# Patient Record
Sex: Female | Born: 1945 | Race: Black or African American | Hispanic: No | Smoking: Never smoker
Health system: Southern US, Community
[De-identification: ages and names within clinical notes are randomized; demographics above are authoritative.]

## PROBLEM LIST (undated history)

## (undated) DIAGNOSIS — M810 Age-related osteoporosis without current pathological fracture: Secondary | ICD-10-CM

## (undated) HISTORY — PX: OTHER SURGICAL HISTORY: SHX169

## (undated) HISTORY — PX: APPENDECTOMY: SHX54

## (undated) HISTORY — DX: Age-related osteoporosis without current pathological fracture: M81.0

## (undated) HISTORY — PX: TUBAL LIGATION: SHX77

## (undated) HISTORY — PX: TONSILLECTOMY: SUR1361

---

## 2002-09-15 ENCOUNTER — Encounter: Payer: Self-pay | Admitting: Cardiology

## 2002-09-15 ENCOUNTER — Encounter: Admission: RE | Admit: 2002-09-15 | Discharge: 2002-09-15 | Payer: Self-pay | Admitting: Cardiology

## 2002-10-19 ENCOUNTER — Encounter: Payer: Self-pay | Admitting: Cardiology

## 2002-10-19 ENCOUNTER — Encounter: Admission: RE | Admit: 2002-10-19 | Discharge: 2002-10-19 | Payer: Self-pay | Admitting: Cardiology

## 2002-11-20 ENCOUNTER — Encounter: Payer: Self-pay | Admitting: Cardiology

## 2002-11-20 ENCOUNTER — Encounter: Admission: RE | Admit: 2002-11-20 | Discharge: 2002-11-20 | Payer: Self-pay | Admitting: Cardiology

## 2003-03-22 ENCOUNTER — Ambulatory Visit (HOSPITAL_COMMUNITY): Admission: RE | Admit: 2003-03-22 | Discharge: 2003-03-22 | Payer: Self-pay | Admitting: Gastroenterology

## 2003-12-28 ENCOUNTER — Encounter: Admission: RE | Admit: 2003-12-28 | Discharge: 2003-12-28 | Payer: Self-pay | Admitting: Internal Medicine

## 2004-12-30 ENCOUNTER — Encounter: Admission: RE | Admit: 2004-12-30 | Discharge: 2004-12-30 | Payer: Self-pay | Admitting: Internal Medicine

## 2005-01-15 ENCOUNTER — Encounter: Admission: RE | Admit: 2005-01-15 | Discharge: 2005-01-15 | Payer: Self-pay | Admitting: Internal Medicine

## 2006-01-20 ENCOUNTER — Encounter: Admission: RE | Admit: 2006-01-20 | Discharge: 2006-01-20 | Payer: Self-pay | Admitting: Internal Medicine

## 2006-11-09 ENCOUNTER — Encounter: Admission: RE | Admit: 2006-11-09 | Discharge: 2006-11-09 | Payer: Self-pay | Admitting: Internal Medicine

## 2007-02-08 ENCOUNTER — Encounter: Admission: RE | Admit: 2007-02-08 | Discharge: 2007-02-08 | Payer: Self-pay | Admitting: Internal Medicine

## 2009-01-21 ENCOUNTER — Encounter: Admission: RE | Admit: 2009-01-21 | Discharge: 2009-01-21 | Payer: Self-pay | Admitting: Internal Medicine

## 2010-03-09 ENCOUNTER — Encounter: Payer: Self-pay | Admitting: Internal Medicine

## 2010-07-04 NOTE — Op Note (Signed)
NAME:  Ashley Dominguez, Ashley Dominguez                     ACCOUNT NO.:  [id number]   MEDICAL RECORD NO.:  [id number]                   PATIENT TYPE:  AMB   LOCATION:  ENDO                                 FACILITY:  [facility]   PHYSICIAN:  Petra Kuba, M.D.                 DATE OF BIRTH:  1945-10-22   DATE OF PROCEDURE:  03/22/2003  DATE OF DISCHARGE:                                 OPERATIVE REPORT   PROCEDURE:  Colonoscopy.   INDICATIONS:  Screening.  Consent was signed after risks, benefits, methods  and options were thoroughly discussed in the office.   MEDICATIONS USED:  1. Demerol 60 mg .  2. Versed 5 mg.   DESCRIPTION OF PROCEDURE:  Rectal inspection is pertinent for external  hemorrhoids.  Digital examination was negative.  Video pediatric adjustable  colonoscope was inserted and easily advanced around the colon to the cecum.  This did not require any abdominal pressure or any position changes.  No  abnormalities were seen on insertion.  The cecum was identified by the  appendiceal orifice and the ileocecal valve.  We backed the scope out and  then inserted a short way into the terminal ileum, which was normal.  Photo  documentation was obtained.  The scope was slowly withdrawn.   The cecum, ascending, transverse and majority of the descending was normal.  There was a rare early left-sided diverticula just beginning to form on the  left.  Once back in the rectum, anorectal pull through in retroflexion  confirms some small hemorrhoids.   The scope was re-inserted a short way up the left side of the colon.  Air  was suctioned and scope removed.  The patient tolerated the procedure well,  there were no obvious immediate complication.   ENDOSCOPIC DIAGNOSES:  1. Internal/external small hemorrhoids.  2. Rare early left-sided diverticula, just being started.  3. Otherwise within normal limits to the terminal ileum.   PLAN:  Yearly rectals and guaiacs per either gynecology or Dr.  Shana Chute.  Happy to see back p.r.n., otherwise repeat screening in 5-10 years.                                               Petra Kuba, M.D.    MEM/MEDQ  D:  03/22/2003  T:  03/22/2003  Job:  [id number]   cc:   Osvaldo Shipper. Spruill, M.D.  [address] [address]  [place]  [place] [address]  Fax: [phone]

## 2010-08-21 ENCOUNTER — Other Ambulatory Visit: Payer: Self-pay | Admitting: Internal Medicine

## 2010-08-21 DIAGNOSIS — Z1231 Encounter for screening mammogram for malignant neoplasm of breast: Secondary | ICD-10-CM

## 2010-08-28 ENCOUNTER — Ambulatory Visit
Admission: RE | Admit: 2010-08-28 | Discharge: 2010-08-28 | Disposition: A | Discharge status: Home / Self Care | Payer: Private Health Insurance - Indemnity | Source: Ambulatory Visit | Attending: Internal Medicine | Admitting: Internal Medicine

## 2010-08-28 DIAGNOSIS — Z1231 Encounter for screening mammogram for malignant neoplasm of breast: Secondary | ICD-10-CM

## 2012-01-19 ENCOUNTER — Other Ambulatory Visit: Payer: Self-pay | Admitting: Internal Medicine

## 2012-01-19 DIAGNOSIS — Z1231 Encounter for screening mammogram for malignant neoplasm of breast: Secondary | ICD-10-CM

## 2012-01-27 ENCOUNTER — Ambulatory Visit
Admission: RE | Admit: 2012-01-27 | Discharge: 2012-01-27 | Disposition: A | Discharge status: Home / Self Care | Payer: Private Health Insurance - Indemnity | Source: Ambulatory Visit | Attending: Internal Medicine | Admitting: Internal Medicine

## 2012-01-27 DIAGNOSIS — Z1231 Encounter for screening mammogram for malignant neoplasm of breast: Secondary | ICD-10-CM

## 2012-02-23 LAB — HM DEXA SCAN

## 2013-02-27 ENCOUNTER — Other Ambulatory Visit: Payer: Self-pay

## 2013-02-27 DIAGNOSIS — Z1231 Encounter for screening mammogram for malignant neoplasm of breast: Secondary | ICD-10-CM

## 2013-03-22 ENCOUNTER — Ambulatory Visit
Admission: RE | Admit: 2013-03-22 | Discharge: 2013-03-22 | Disposition: A | Discharge status: Home / Self Care | Payer: BC Managed Care – PPO | Source: Ambulatory Visit

## 2013-03-22 DIAGNOSIS — Z1231 Encounter for screening mammogram for malignant neoplasm of breast: Secondary | ICD-10-CM

## 2014-07-09 ENCOUNTER — Other Ambulatory Visit: Payer: Self-pay

## 2014-07-09 DIAGNOSIS — Z1231 Encounter for screening mammogram for malignant neoplasm of breast: Secondary | ICD-10-CM

## 2014-07-13 ENCOUNTER — Ambulatory Visit
Admission: RE | Admit: 2014-07-13 | Discharge: 2014-07-13 | Disposition: A | Discharge status: Home / Self Care | Payer: BLUE CROSS/BLUE SHIELD | Source: Ambulatory Visit

## 2014-07-13 DIAGNOSIS — Z1231 Encounter for screening mammogram for malignant neoplasm of breast: Secondary | ICD-10-CM

## 2015-01-07 LAB — HM COLONOSCOPY

## 2015-07-10 ENCOUNTER — Other Ambulatory Visit: Payer: Self-pay

## 2015-07-10 DIAGNOSIS — Z1231 Encounter for screening mammogram for malignant neoplasm of breast: Secondary | ICD-10-CM

## 2015-10-01 ENCOUNTER — Ambulatory Visit: Payer: BLUE CROSS/BLUE SHIELD

## 2018-01-07 ENCOUNTER — Telehealth: Payer: Self-pay

## 2018-01-07 NOTE — Telephone Encounter (Signed)
Returned the pt's call and left a message that I was returning her call to schedule her annual.

## 2018-01-11 ENCOUNTER — Ambulatory Visit (INDEPENDENT_AMBULATORY_CARE_PROVIDER_SITE_OTHER): Payer: 59 | Admitting: Internal Medicine

## 2018-01-11 ENCOUNTER — Ambulatory Visit: Payer: 59 | Admitting: Internal Medicine

## 2018-01-11 ENCOUNTER — Encounter: Payer: Self-pay | Admitting: Internal Medicine

## 2018-01-11 VITALS — BP 140/90 | HR 76 | Temp 98.1°F | Ht 62.0 in | Wt 125.2 lb

## 2018-01-11 VITALS — BP 140/90 | HR 76 | Temp 98.1°F | Ht 62.0 in | Wt 125.0 lb

## 2018-01-11 DIAGNOSIS — N898 Other specified noninflammatory disorders of vagina: Secondary | ICD-10-CM | POA: Diagnosis not present

## 2018-01-11 DIAGNOSIS — E2839 Other primary ovarian failure: Secondary | ICD-10-CM

## 2018-01-11 DIAGNOSIS — Z1239 Encounter for other screening for malignant neoplasm of breast: Secondary | ICD-10-CM

## 2018-01-11 DIAGNOSIS — Z0001 Encounter for general adult medical examination with abnormal findings: Secondary | ICD-10-CM | POA: Diagnosis not present

## 2018-01-11 DIAGNOSIS — Z1211 Encounter for screening for malignant neoplasm of colon: Secondary | ICD-10-CM | POA: Diagnosis not present

## 2018-01-11 DIAGNOSIS — R3129 Other microscopic hematuria: Secondary | ICD-10-CM

## 2018-01-11 DIAGNOSIS — R6882 Decreased libido: Secondary | ICD-10-CM

## 2018-01-11 LAB — POCT URINALYSIS DIPSTICK
BILIRUBIN UA: NEGATIVE
Glucose, UA: NEGATIVE
KETONES UA: 15
Leukocytes, UA: NEGATIVE
Nitrite, UA: NEGATIVE
PH UA: 5.5 (ref 5.0–8.0)
Protein, UA: NEGATIVE
UROBILINOGEN UA: 0.2 U/dL

## 2018-01-11 NOTE — Patient Instructions (Signed)
Preventive Care 72 Years and Older, Female Preventive care refers to lifestyle choices and visits with your health care provider that can promote health and wellness. What does preventive care include?  A yearly physical exam. This is also called an annual well check.  Dental exams once or twice a year.  Routine eye exams. Ask your health care provider how often you should have your eyes checked.  Personal lifestyle choices, including: ? Daily care of your teeth and gums. ? Regular physical activity. ? Eating a healthy diet. ? Avoiding tobacco and drug use. ? Limiting alcohol use. ? Practicing safe sex. ? Taking low-dose aspirin every day. ? Taking vitamin and mineral supplements as recommended by your health care provider. What happens during an annual well check? The services and screenings done by your health care provider during your annual well check will depend on your age, overall health, lifestyle risk factors, and family history of disease. Counseling Your health care provider may ask you questions about your:  Alcohol use.  Tobacco use.  Drug use.  Emotional well-being.  Home and relationship well-being.  Sexual activity.  Eating habits.  History of falls.  Memory and ability to understand (cognition).  Work and work environment.  Reproductive health.  Screening You may have the following tests or measurements:  Height, weight, and BMI.  Blood pressure.  Lipid and cholesterol levels. These may be checked every 5 years, or more frequently if you are over 50 years old.  Skin check.  Lung cancer screening. You may have this screening every year starting at age 55 if you have a 30-pack-year history of smoking and currently smoke or have quit within the past 15 years.  Fecal occult blood test (FOBT) of the stool. You may have this test every year starting at age 50.  Flexible sigmoidoscopy or colonoscopy. You may have a sigmoidoscopy every 5 years or  a colonoscopy every 10 years starting at age 50.  Hepatitis C blood test.  Hepatitis B blood test.  Sexually transmitted disease (STD) testing.  Diabetes screening. This is done by checking your blood sugar (glucose) after you have not eaten for a while (fasting). You may have this done every 1-3 years.  Bone density scan. This is done to screen for osteoporosis. You may have this done starting at age 65.  Mammogram. This may be done every 1-2 years. Talk to your health care provider about how often you should have regular mammograms.  Talk with your health care provider about your test results, treatment options, and if necessary, the need for more tests. Vaccines Your health care provider may recommend certain vaccines, such as:  Influenza vaccine. This is recommended every year.  Tetanus, diphtheria, and acellular pertussis (Tdap, Td) vaccine. You may need a Td booster every 10 years.  Varicella vaccine. You may need this if you have not been vaccinated.  Zoster vaccine. You may need this after age 60.  Measles, mumps, and rubella (MMR) vaccine. You may need at least one dose of MMR if you were born in 1957 or later. You may also need a second dose.  Pneumococcal 13-valent conjugate (PCV13) vaccine. One dose is recommended after age 65.  Pneumococcal polysaccharide (PPSV23) vaccine. One dose is recommended after age 65.  Meningococcal vaccine. You may need this if you have certain conditions.  Hepatitis A vaccine. You may need this if you have certain conditions or if you travel or work in places where you may be exposed to hepatitis   A.  Hepatitis B vaccine. You may need this if you have certain conditions or if you travel or work in places where you may be exposed to hepatitis B.  Haemophilus influenzae type b (Hib) vaccine. You may need this if you have certain conditions.  Talk to your health care provider about which screenings and vaccines you need and how often you  need them. This information is not intended to replace advice given to you by your health care provider. Make sure you discuss any questions you have with your health care provider. Document Released: 03/01/2015 Document Revised: 10/23/2015 Document Reviewed: 12/04/2014 Elsevier Interactive Patient Education  2018 Elsevier Inc.  

## 2018-01-11 NOTE — Progress Notes (Addendum)
Subjective:     Patient ID: Ashley Dominguez , female    DOB: [birth year] , 72 y.o.   MRN: [id number]  CC- Need my annual check up  HPI Pt is here for yearly physical. Was supposed to have it done in June, but missed it.  Her BP at home usually is around 120's systolic. Has noticed on occasion stool smelling matter from vagina in the past few months. Has made certain was not from rectal area. Denies any vaginal pain.  Past Medical History:  Diagnosis Date  . Osteoporosis      Family History  Problem Relation Age of Onset  . Cancer Mother   . Alcohol abuse Father   . Diabetes Father   . Diabetes Brother   . Cancer Maternal Aunt     No current outpatient medications on file.   Not on File   Review of Systems  Constitutional: Negative for appetite change, chills, diaphoresis, fatigue, fever and unexpected weight change.  HENT: Positive for congestion. Negative for dental problem, drooling, ear discharge, ear pain, facial swelling, hearing loss, mouth sores, nosebleeds, postnasal drip, rhinorrhea, sinus pressure, sinus pain, sneezing, sore throat, tinnitus, trouble swallowing and voice change.        Early in the fall noticed stuffy nose, but fine now.   Eyes: Positive for visual disturbance. Negative for photophobia, pain, discharge, redness and itching.       Has cataract and is planning to have it done in the next 3 months  Respiratory: Negative for cough and shortness of breath.   Cardiovascular: Negative for chest pain, palpitations and leg swelling.  Gastrointestinal: Negative.   Endocrine: Negative for cold intolerance, heat intolerance, polydipsia, polyphagia and polyuria.  Genitourinary: Positive for frequency and vaginal discharge. Negative for dysuria, enuresis, pelvic pain, urgency, vaginal bleeding and vaginal pain.       Drinks a lot. She has noticed in the past week, stool like matter from vagina, but thinner and smells like stool.   Musculoskeletal: Negative.    Skin: Negative.   Allergic/Immunologic: Positive for environmental allergies and food allergies.       Is lactose intolerant  Neurological: Negative for dizziness, tremors, syncope, speech difficulty, weakness, numbness and headaches.  Hematological: Negative for adenopathy. Does not bruise/bleed easily.  Psychiatric/Behavioral: Positive for sleep disturbance. The patient is nervous/anxious.        Only sleeps 4-5 a night when she works. Has been sleeping this way for 30+ years. When she is off she sleeps 7-8h per night.  Sometimes  Gets anxious due to finances. Denies depression.      There were no vitals filed for this visit. There is no height or weight on file to calculate BMI.   Objective:  Physical Exam  There were no vitals taken for this visit.  General Appearance:    Alert, cooperative, no distress, appears younger than stated age  Head:    Normocephalic, without obvious abnormality, atraumatic  Eyes:    PERRL, conjunctiva/corneas clear, EOM's intact, both eyes  Ears:    Normal TM's and external ear canals, both ears  Nose:   Nares normal, septum midline, mucosa normal, no drainage    or sinus tenderness  Throat:   Lips, mucosa, and tongue normal; teeth and gums normal  Neck:   Supple, symmetrical, trachea midline, no adenopathy;    thyroid:  no enlargement/tenderness/nodules; no carotid   bruit   Back:     Symmetric, no curvature, ROM normal, no CVA tenderness  Lungs:     Clear to auscultation bilaterally, respirations unlabored  Chest Wall:    No tenderness or deformity   Heart:    Regular rate and rhythm, S1 and S2 normal, no murmur, rub   or gallop  Breast Exam:    No tenderness, masses, or nipple abnormality  Abdomen:     Soft, non-tender, bowel sounds active all four quadrants,    no masses, no organomegaly  Genitalia:    Normal female without lesion, cervix is pink. Has small amount of dark yellow slightly thick discharge, but does not have an odor that I noted. I  obtained a culture.   Rectal:    Normal tone,  no masses or tenderness; Has a large skin tag externally where she had an external hemorrhoid in the past. This does not bother her.    guaiac negative stool  Extremities:   Extremities normal, atraumatic, no cyanosis or edema  Pulses:   2+ and symmetric all extremities  Skin:   Skin color, texture, turgor normal, no rashes or lesions  Lymph nodes:   Cervical, supraclavicular, and axillary nodes normal  Neurologic:   CNII-XII intact, normal strength, sensation and reflexes    Throughout. Normal Rhomberg, tandem gait, heel and tip toe walk, and finger to nose.       Assessment And Plan:    1. Encounter for general adult medical examination with abnormal findings- routine. FU 1 y - Lipid Profile - CMP14 + Anion Gap - POCT Urinalysis Dipstick (81002) - T3, free - CBC no Diff - TSH - T4, Free 2. Vaginal discharge- acute. Aerobic and anaerobic culture was sent out. - Anaerobic and Aerobic Culture We will inform her of the results come back.  3. Screening for breast cancer- screen.  - MM Digital Screening; Future  4. Decreased estrogen level- chronic - DG DXA FRACTURE ASSESSMENT; Future  5. Hematuria, microscopic- mild.  - Culture, Urine     Marciana Uplinger RODRIGUEZ-SOUTHWORTH, PA-C

## 2018-01-12 ENCOUNTER — Encounter: Payer: Self-pay | Admitting: Internal Medicine

## 2018-01-12 LAB — CBC
HEMATOCRIT: 36.8 % (ref 34.0–46.6)
HEMOGLOBIN: 12.1 g/dL (ref 11.1–15.9)
MCH: 30.1 pg (ref 26.6–33.0)
MCHC: 32.9 g/dL (ref 31.5–35.7)
MCV: 92 fL (ref 79–97)
Platelets: 382 10*3/uL (ref 150–450)
RBC: 4.02 x10E6/uL (ref 3.77–5.28)
RDW: 11.7 % — ABNORMAL LOW (ref 12.3–15.4)
WBC: 4.4 10*3/uL (ref 3.4–10.8)

## 2018-01-12 LAB — CMP14 + ANION GAP
A/G RATIO: 1.8 (ref 1.2–2.2)
ALBUMIN: 4.4 g/dL (ref 3.5–4.8)
ALT: 10 IU/L (ref 0–32)
ANION GAP: 15 mmol/L (ref 10.0–18.0)
AST: 14 IU/L (ref 0–40)
Alkaline Phosphatase: 60 IU/L (ref 39–117)
BUN / CREAT RATIO: 15 (ref 12–28)
BUN: 13 mg/dL (ref 8–27)
Bilirubin Total: 0.5 mg/dL (ref 0.0–1.2)
CO2: 25 mmol/L (ref 20–29)
CREATININE: 0.86 mg/dL (ref 0.57–1.00)
Calcium: 9.5 mg/dL (ref 8.7–10.3)
Chloride: 99 mmol/L (ref 96–106)
GFR calc Af Amer: 78 mL/min/{1.73_m2} (ref >59)
GFR, EST NON AFRICAN AMERICAN: 68 mL/min/{1.73_m2} (ref >59)
GLOBULIN, TOTAL: 2.5 g/dL (ref 1.5–4.5)
Glucose: 93 mg/dL (ref 65–99)
POTASSIUM: 3.7 mmol/L (ref 3.5–5.2)
SODIUM: 139 mmol/L (ref 134–144)
Total Protein: 6.9 g/dL (ref 6.0–8.5)

## 2018-01-12 LAB — LIPID PANEL
CHOLESTEROL TOTAL: 175 mg/dL (ref 100–199)
Chol/HDL Ratio: 2.4 ratio (ref 0.0–4.4)
HDL: 74 mg/dL (ref >39)
LDL CALC: 89 mg/dL (ref 0–99)
TRIGLYCERIDES: 62 mg/dL (ref 0–149)
VLDL Cholesterol Cal: 12 mg/dL (ref 5–40)

## 2018-01-12 LAB — POC HEMOCCULT BLD/STL (OFFICE/1-CARD/DIAGNOSTIC): Fecal Occult Blood, POC: NEGATIVE

## 2018-01-12 LAB — URINE CULTURE

## 2018-01-12 NOTE — Progress Notes (Signed)
This was open in error

## 2018-01-15 LAB — ANAEROBIC AND AEROBIC CULTURE

## 2018-01-18 ENCOUNTER — Other Ambulatory Visit: Payer: Self-pay | Admitting: Internal Medicine

## 2018-01-18 DIAGNOSIS — N898 Other specified noninflammatory disorders of vagina: Secondary | ICD-10-CM

## 2018-01-18 MED ORDER — SULFAMETHOXAZOLE-TRIMETHOPRIM 800-160 MG PO TABS: 1.0000 | ORAL_TABLET | Freq: Two times a day (BID) | ORAL | 0 refills | Status: AC

## 2018-01-18 NOTE — Progress Notes (Unsigned)
I called pt and answered her questions about her vaginal swab results. She said she is certain she sees stool when she wipes from vagina sometimes, and would like to be referred to a specialist.

## 2018-01-24 NOTE — Progress Notes (Signed)
Patient notified she stated she feels better.

## 2018-01-27 ENCOUNTER — Other Ambulatory Visit: Payer: Self-pay | Admitting: Internal Medicine

## 2018-01-27 DIAGNOSIS — E2839 Other primary ovarian failure: Secondary | ICD-10-CM

## 2018-02-23 ENCOUNTER — Ambulatory Visit: Payer: BLUE CROSS/BLUE SHIELD

## 2018-02-24 ENCOUNTER — Inpatient Hospital Stay: Admission: RE | Admit: 2018-02-24 | Payer: BLUE CROSS/BLUE SHIELD | Source: Ambulatory Visit

## 2018-02-25 ENCOUNTER — Ambulatory Visit
Admission: RE | Admit: 2018-02-25 | Discharge: 2018-02-25 | Disposition: A | Discharge status: Home / Self Care | Payer: 59 | Source: Ambulatory Visit | Attending: Internal Medicine | Admitting: Internal Medicine

## 2018-02-25 DIAGNOSIS — Z1239 Encounter for other screening for malignant neoplasm of breast: Secondary | ICD-10-CM

## 2018-03-29 ENCOUNTER — Other Ambulatory Visit: Payer: BLUE CROSS/BLUE SHIELD

## 2018-03-31 ENCOUNTER — Other Ambulatory Visit: Payer: BLUE CROSS/BLUE SHIELD

## 2019-01-17 ENCOUNTER — Encounter: Payer: 59 | Admitting: Internal Medicine

## 2019-01-26 ENCOUNTER — Ambulatory Visit (INDEPENDENT_AMBULATORY_CARE_PROVIDER_SITE_OTHER): Payer: 59 | Admitting: Internal Medicine

## 2019-01-26 ENCOUNTER — Encounter: Payer: Self-pay | Admitting: Internal Medicine

## 2019-01-26 ENCOUNTER — Other Ambulatory Visit: Payer: Self-pay

## 2019-01-26 VITALS — BP 124/78 | HR 68 | Temp 98.0°F | Ht 62.0 in | Wt 124.8 lb

## 2019-01-26 DIAGNOSIS — Z0001 Encounter for general adult medical examination with abnormal findings: Secondary | ICD-10-CM | POA: Diagnosis not present

## 2019-01-26 DIAGNOSIS — R9431 Abnormal electrocardiogram [ECG] [EKG]: Secondary | ICD-10-CM

## 2019-01-26 DIAGNOSIS — F1020 Alcohol dependence, uncomplicated: Secondary | ICD-10-CM | POA: Diagnosis not present

## 2019-01-26 DIAGNOSIS — Z1211 Encounter for screening for malignant neoplasm of colon: Secondary | ICD-10-CM

## 2019-01-26 LAB — POCT URINALYSIS DIPSTICK
Bilirubin, UA: NEGATIVE
Glucose, UA: NEGATIVE
Ketones, UA: NEGATIVE
Nitrite, UA: NEGATIVE
Protein, UA: NEGATIVE
Spec Grav, UA: >=1.03 — AB (ref 1.010–1.025)
Urobilinogen, UA: 0.2 E.U./dL
pH, UA: 5.5 (ref 5.0–8.0)

## 2019-01-26 NOTE — Progress Notes (Signed)
This visit occurred during the SARS-CoV-2 public health emergency.  Safety protocols were in place, including screening questions prior to the visit, additional usage of staff PPE, and extensive cleaning of exam room while observing appropriate contact time as indicated for disinfecting solutions.  Subjective:     Patient ID: Ashley Dominguez , female    DOB: 1945/09/14 , 73 y.o.   MRN: [id number]   Chief Complaint  Patient presents with  . Annual Exam    HPI Pt is here for annual physical. Denies having any complaints. Admits she has been drinking more since staying at home. Drinks 3-4 beers a day and 3-5 cocktails a week.    Past Medical History:  Diagnosis Date  . Osteoporosis      Family History  Problem Relation Age of Onset  . Cancer Mother   . Alcohol abuse Father   . Diabetes Father   . Diabetes Brother   . Cancer Maternal Aunt   . Breast cancer Neg Hx     No current outpatient medications on file.   No Known Allergies   Review of Systems  All neg Today's Vitals   01/26/19 1622  BP: 124/78  Pulse: 68  Temp: 98 F (36.7 C)  TempSrc: Oral  Weight: 124 lb 12.8 oz (56.6 kg)  Height: 5\' 2"  (1.575 m)   Body mass index is 22.83 kg/m.   Objective:  Physical Exam  BP 124/78 (BP Location: Left Arm, Patient Position: Sitting, Cuff Size: Normal)   Pulse 68   Temp 98 F (36.7 C) (Oral)   Ht 5\' 2"  (1.575 m)   Wt 124 lb 12.8 oz (56.6 kg)   BMI 22.83 kg/m   General Appearance:    Alert, cooperative, no distress, appears stated age  Head:    Normocephalic, without obvious abnormality, atraumatic  Eyes:    PERRL, conjunctiva/corneas clear, EOM's intact, fundi    benign, both eyes  Ears:    Normal TM's and external ear canals, both ears  Nose:   Nares normal, septum midline, mucosa normal, no drainage    or sinus tenderness  Throat:   Lips, mucosa, and tongue normal; teeth and gums normal  Neck:   Supple, symmetrical, trachea midline, no adenopathy;     thyroid:  no enlargement/tenderness/nodules; no carotid   bruit or JVD  Back:     Symmetric, no curvature, ROM normal, no CVA tenderness  Lungs:     Clear to auscultation bilaterally, respirations unlabored  Chest Wall:    No tenderness or deformity   Heart:    Regular rate and rhythm, S1 and S2 normal, no murmur, rub   or gallop  Breast Exam:    No tenderness, masses, or nipple abnormality  Abdomen:     Soft, non-tender, bowel sounds active all four quadrants,    no masses, no organomegaly  Genitalia:    declined  Rectal:    Normal tone, normal prostate, no masses or tenderness;   guaiac negative stool  Extremities:   Extremities normal, atraumatic, no cyanosis or edema  Pulses:   2+ and symmetric all extremities  Skin:   Skin color, texture, turgor normal, no rashes or lesions  Lymph nodes:   Cervical, supraclavicular, and axillary nodes normal  Neurologic:   CNII-XII intact, normal strength, sensation and reflexes    throughout  EKG- shows T abnormality- anterolateral ischemia which is unchanged from last year.      Assessment And Plan:   1. Encounter for general  adult medical examination with abnormal findings- routine. FU 1 y.  - POCT Urinalysis Dipstick (81002) - EKG 12-Lead - CMP14 + Anion Gap - CBC no Diff - Lipid panel - TSH  2. Abnormal EKG- unchanged from last year's, no action.   3- Alcohol use- new. Discussed with her, that for health benefits, she needs to limit to 7 drinks a week only.      GGT was ordered, but lad did not ran it.      Sylvio Weatherall RODRIGUEZ-SOUTHWORTH, PA-C    THE PATIENT IS ENCOURAGED TO PRACTICE SOCIAL DISTANCING DUE TO THE COVID-19 PANDEMIC.

## 2019-01-27 LAB — CMP14 + ANION GAP
ALT: 11 IU/L (ref 0–32)
AST: 18 IU/L (ref 0–40)
Albumin/Globulin Ratio: 2 (ref 1.2–2.2)
Albumin: 4.5 g/dL (ref 3.7–4.7)
Alkaline Phosphatase: 85 IU/L (ref 39–117)
Anion Gap: 11 mmol/L (ref 10.0–18.0)
BUN/Creatinine Ratio: 13 (ref 12–28)
BUN: 12 mg/dL (ref 8–27)
Bilirubin Total: 0.5 mg/dL (ref 0.0–1.2)
CO2: 27 mmol/L (ref 20–29)
Calcium: 9.8 mg/dL (ref 8.7–10.3)
Chloride: 102 mmol/L (ref 96–106)
Creatinine, Ser: 0.93 mg/dL (ref 0.57–1.00)
GFR calc Af Amer: 71 mL/min/{1.73_m2} (ref >59)
GFR calc non Af Amer: 61 mL/min/{1.73_m2} (ref >59)
Globulin, Total: 2.3 g/dL (ref 1.5–4.5)
Glucose: 105 mg/dL — ABNORMAL HIGH (ref 65–99)
Potassium: 4.6 mmol/L (ref 3.5–5.2)
Sodium: 140 mmol/L (ref 134–144)
Total Protein: 6.8 g/dL (ref 6.0–8.5)

## 2019-01-27 LAB — LIPID PANEL
Chol/HDL Ratio: 2.3 ratio (ref 0.0–4.4)
Cholesterol, Total: 197 mg/dL (ref 100–199)
HDL: 87 mg/dL (ref >39)
LDL Chol Calc (NIH): 96 mg/dL (ref 0–99)
Triglycerides: 78 mg/dL (ref 0–149)
VLDL Cholesterol Cal: 14 mg/dL (ref 5–40)

## 2019-01-27 LAB — CBC
Hematocrit: 38.9 % (ref 34.0–46.6)
Hemoglobin: 12.8 g/dL (ref 11.1–15.9)
MCH: 30.4 pg (ref 26.6–33.0)
MCHC: 32.9 g/dL (ref 31.5–35.7)
MCV: 92 fL (ref 79–97)
Platelets: 395 10*3/uL (ref 150–450)
RBC: 4.21 x10E6/uL (ref 3.77–5.28)
RDW: 11.8 % (ref 11.7–15.4)
WBC: 6.1 10*3/uL (ref 3.4–10.8)

## 2019-01-27 LAB — TSH: TSH: 0.87 u[IU]/mL (ref 0.450–4.500)

## 2019-01-31 ENCOUNTER — Encounter: Payer: Self-pay | Admitting: Internal Medicine

## 2019-02-01 ENCOUNTER — Encounter: Payer: Self-pay | Admitting: Internal Medicine

## 2019-02-03 ENCOUNTER — Other Ambulatory Visit: Payer: Self-pay | Admitting: Internal Medicine

## 2019-02-03 DIAGNOSIS — R7309 Other abnormal glucose: Secondary | ICD-10-CM

## 2019-02-06 ENCOUNTER — Telehealth: Payer: Self-pay

## 2019-02-06 ENCOUNTER — Encounter: Payer: Self-pay | Admitting: Internal Medicine

## 2019-02-06 NOTE — Telephone Encounter (Signed)
Spoke w/pt gave her [employer] message. Pt stated she did not want to do that at this time but she would reach back out to you about it. She also stated she seen your note. Pt also asked if you can fill out her physical form. She will need it before 2021

## 2019-02-06 NOTE — Telephone Encounter (Signed)
-----   Message from Shelby Mattocks, PA-C sent at 02/06/2019  8:50 AM EST ----- He Mychart message returned back to me. Will you please ask her to read it, or you may read it for her. Thanks.

## 2019-02-11 IMAGING — MG DIGITAL SCREENING BILATERAL MAMMOGRAM WITH CAD
2 series · 2 of 2 positions shown · non-contrast
Comparison: Previous exam(s).

CLINICAL DATA: Screening.

EXAM:
DIGITAL SCREENING BILATERAL MAMMOGRAM WITH CAD

[R CC]
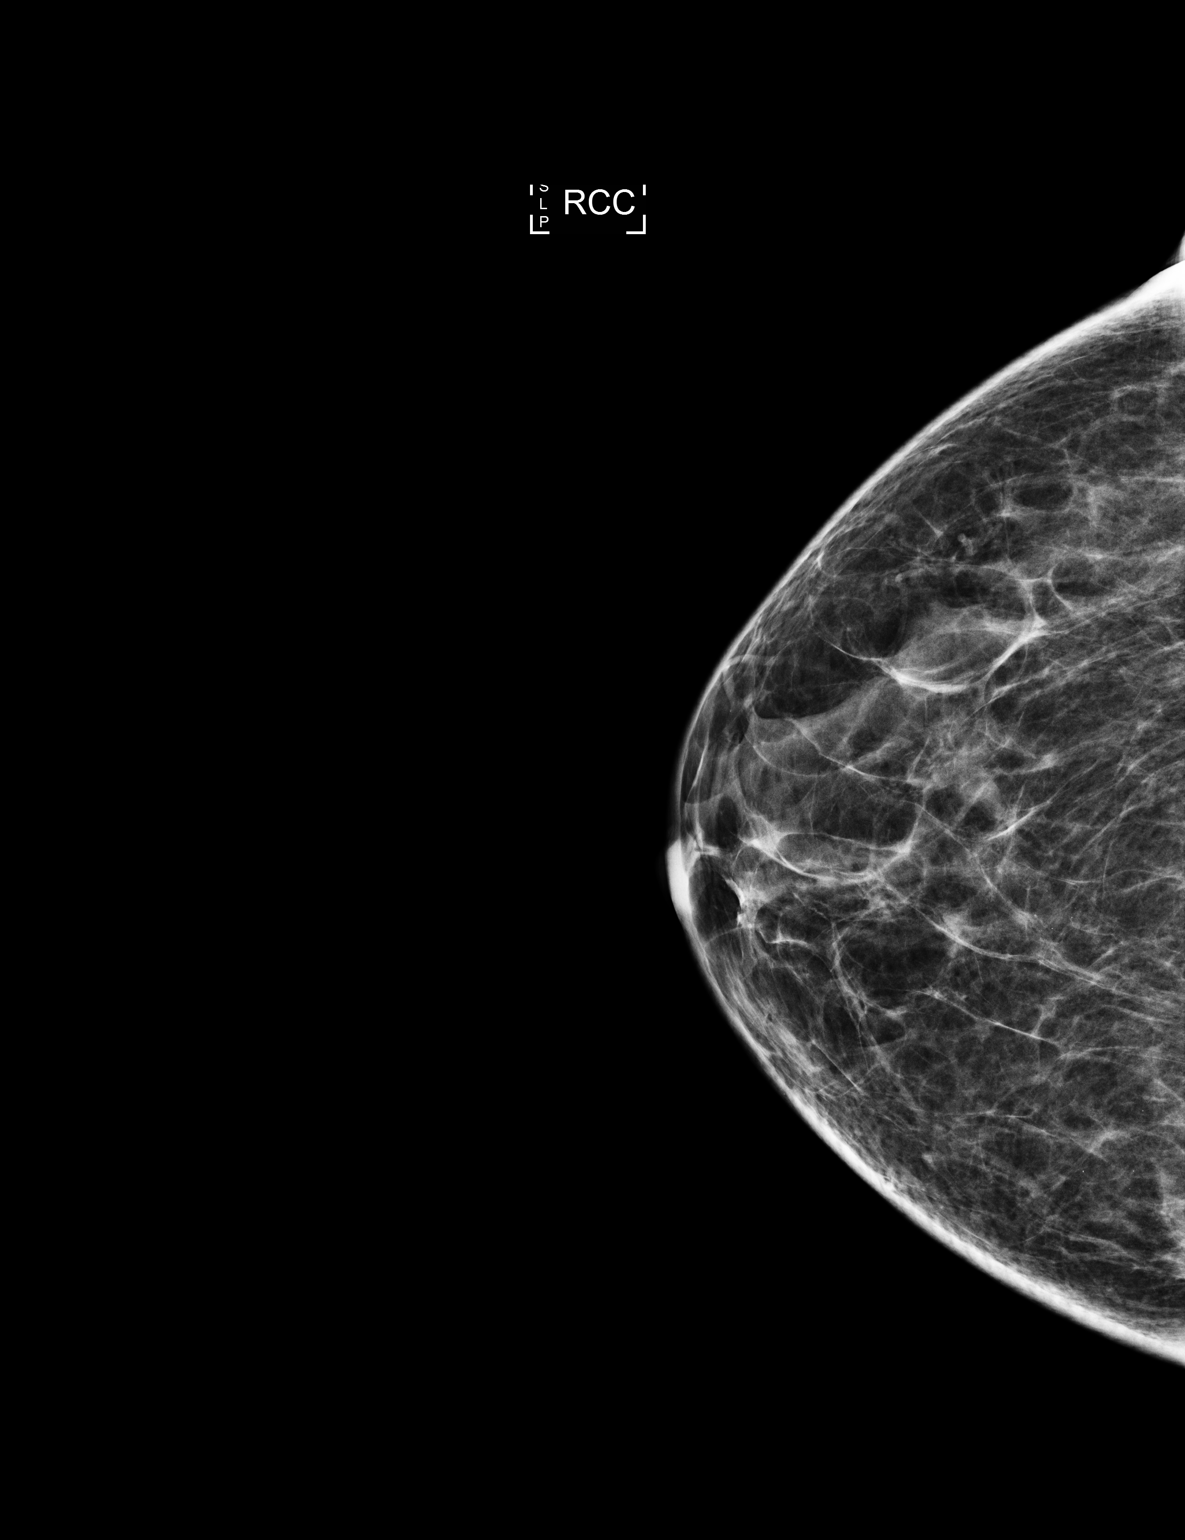

[L CC]
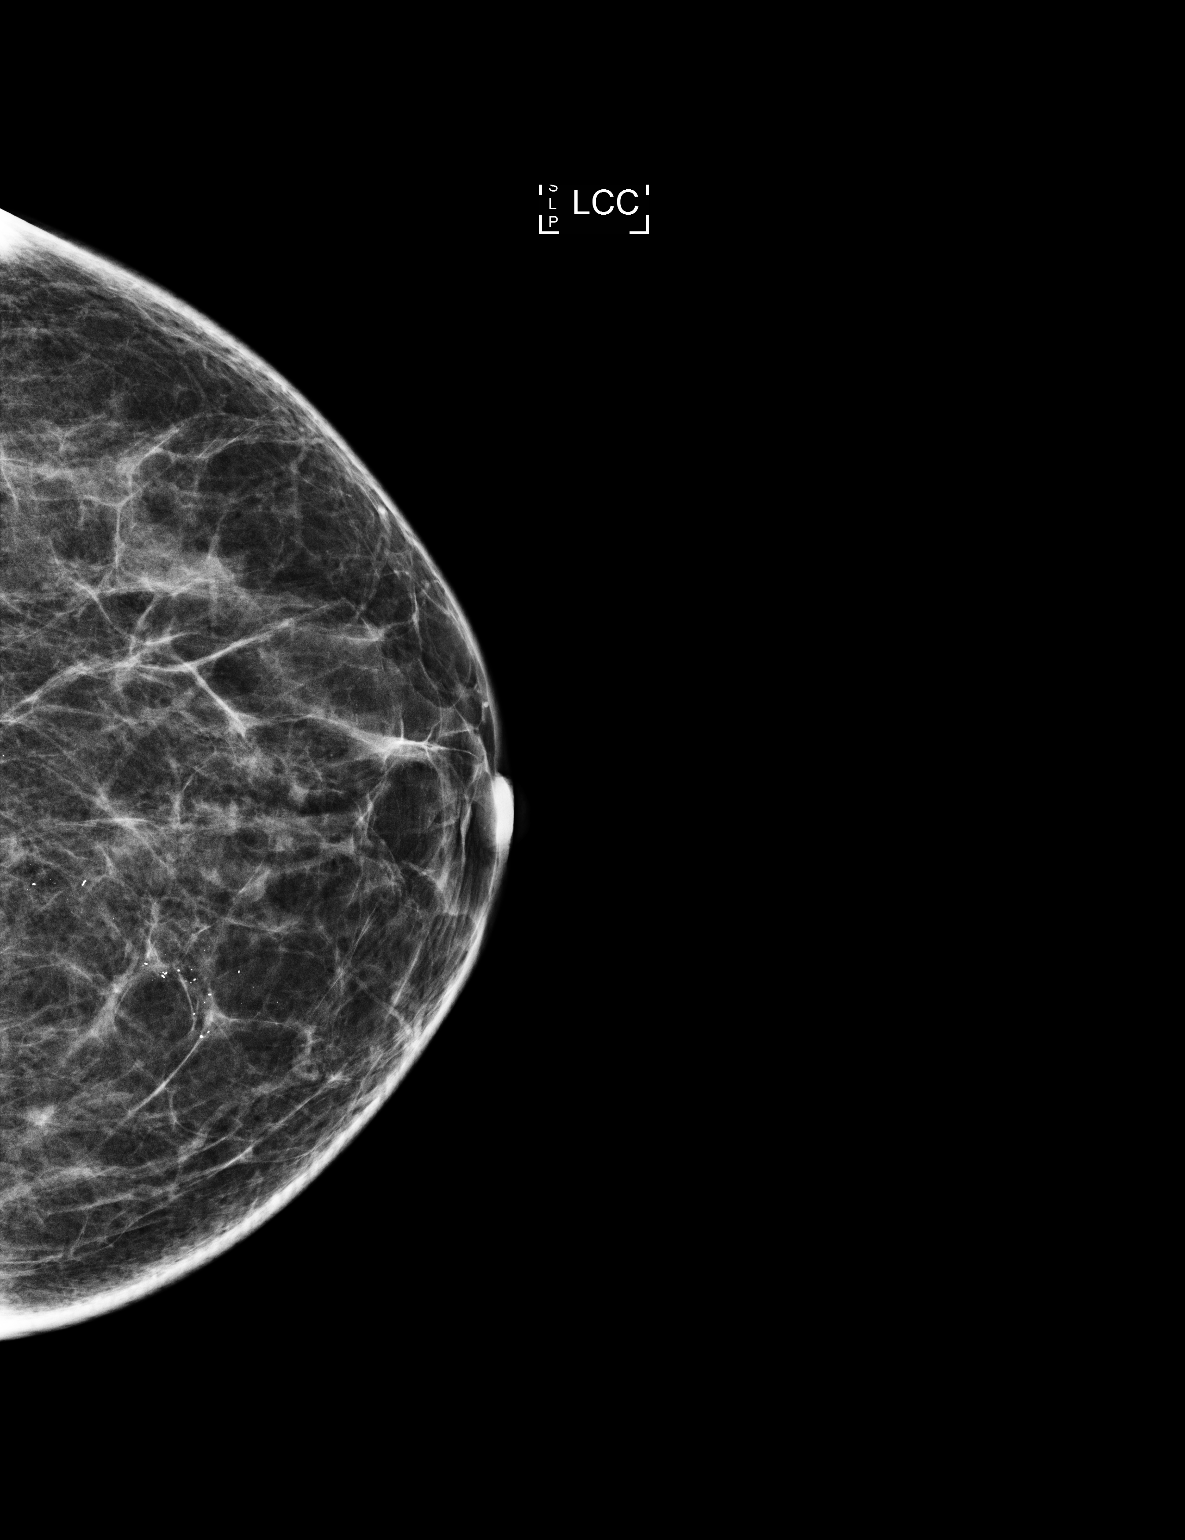

[2 of 2 positions shown; findings below may reference images not displayed]

ACR Breast Density Category c: The breast tissue is heterogeneously
dense, which may obscure small masses.
FINDINGS: There are no findings suspicious for malignancy. Images were
processed with CAD.
IMPRESSION: No mammographic evidence of malignancy. A result letter of this
screening mammogram will be mailed directly to the patient.

RECOMMENDATION:
Screening mammogram in one year. (Code:[id number])

BI-RADS CATEGORY  1: Negative.

## 2019-09-26 ENCOUNTER — Telehealth: Payer: Self-pay

## 2019-09-26 NOTE — Telephone Encounter (Signed)
I returned the pt's call and left a message clarifying what her last appt was for and what her next appt is for because the pt said that she has an employer for that she needed filled out.

## 2019-10-11 ENCOUNTER — Telehealth: Payer: Self-pay

## 2019-10-11 NOTE — Telephone Encounter (Signed)
The pt was given her last and next physical dates.

## 2020-01-03 ENCOUNTER — Telehealth: Payer: Self-pay

## 2020-01-03 NOTE — Telephone Encounter (Signed)
The pt was notified that she can discuss the rx for her massages at her visit next month.

## 2020-02-07 ENCOUNTER — Ambulatory Visit (INDEPENDENT_AMBULATORY_CARE_PROVIDER_SITE_OTHER): Payer: 59 | Admitting: Internal Medicine

## 2020-02-07 ENCOUNTER — Encounter: Payer: Self-pay | Admitting: Internal Medicine

## 2020-02-07 ENCOUNTER — Other Ambulatory Visit: Payer: Self-pay

## 2020-02-07 VITALS — BP 138/74 | Temp 97.9°F | Ht 62.0 in | Wt 123.2 lb

## 2020-02-07 DIAGNOSIS — Z1211 Encounter for screening for malignant neoplasm of colon: Secondary | ICD-10-CM

## 2020-02-07 DIAGNOSIS — Z23 Encounter for immunization: Secondary | ICD-10-CM

## 2020-02-07 DIAGNOSIS — M542 Cervicalgia: Secondary | ICD-10-CM | POA: Diagnosis not present

## 2020-02-07 DIAGNOSIS — E559 Vitamin D deficiency, unspecified: Secondary | ICD-10-CM | POA: Diagnosis not present

## 2020-02-07 DIAGNOSIS — Z0001 Encounter for general adult medical examination with abnormal findings: Secondary | ICD-10-CM

## 2020-02-07 DIAGNOSIS — F1020 Alcohol dependence, uncomplicated: Secondary | ICD-10-CM | POA: Insufficient documentation

## 2020-02-07 NOTE — Patient Instructions (Signed)
Health Maintenance, Female Adopting a healthy lifestyle and getting preventive care are important in promoting health and wellness. Ask your health care provider about:  The right schedule for you to have regular tests and exams.  Things you can do on your own to prevent diseases and keep yourself healthy. What should I know about diet, weight, and exercise? Eat a healthy diet   Eat a diet that includes plenty of vegetables, fruits, low-fat dairy products, and lean protein.  Do not eat a lot of foods that are high in solid fats, added sugars, or sodium. Maintain a healthy weight Body mass index (BMI) is used to identify weight problems. It estimates body fat based on height and weight. Your health care provider can help determine your BMI and help you achieve or maintain a healthy weight. Get regular exercise Get regular exercise. This is one of the most important things you can do for your health. Most adults should:  Exercise for at least 150 minutes each week. The exercise should increase your heart rate and make you sweat (moderate-intensity exercise).  Do strengthening exercises at least twice a week. This is in addition to the moderate-intensity exercise.  Spend less time sitting. Even light physical activity can be beneficial. Watch cholesterol and blood lipids Have your blood tested for lipids and cholesterol at 74 years of age, then have this test every 5 years. Have your cholesterol levels checked more often if:  Your lipid or cholesterol levels are high.  You are older than 74 years of age.  You are at high risk for heart disease. What should I know about cancer screening? Depending on your health history and family history, you may need to have cancer screening at various ages. This may include screening for:  Breast cancer.  Cervical cancer.  Colorectal cancer.  Skin cancer.  Lung cancer. What should I know about heart disease, diabetes, and high blood  pressure? Blood pressure and heart disease  High blood pressure causes heart disease and increases the risk of stroke. This is more likely to develop in people who have high blood pressure readings, are of African descent, or are overweight.  Have your blood pressure checked: ? Every 3-5 years if you are 18-39 years of age. ? Every year if you are 40 years old or older. Diabetes Have regular diabetes screenings. This checks your fasting blood sugar level. Have the screening done:  Once every three years after age 40 if you are at a normal weight and have a low risk for diabetes.  More often and at a younger age if you are overweight or have a high risk for diabetes. What should I know about preventing infection? Hepatitis B If you have a higher risk for hepatitis B, you should be screened for this virus. Talk with your health care provider to find out if you are at risk for hepatitis B infection. Hepatitis C Testing is recommended for:  Everyone born from 1945 through 1965.  Anyone with known risk factors for hepatitis C. Sexually transmitted infections (STIs)  Get screened for STIs, including gonorrhea and chlamydia, if: ? You are sexually active and are younger than 74 years of age. ? You are older than 74 years of age and your health care provider tells you that you are at risk for this type of infection. ? Your sexual activity has changed since you were last screened, and you are at increased risk for chlamydia or gonorrhea. Ask your health care provider if   you are at risk.  Ask your health care provider about whether you are at high risk for HIV. Your health care provider may recommend a prescription medicine to help prevent HIV infection. If you choose to take medicine to prevent HIV, you should first get tested for HIV. You should then be tested every 3 months for as long as you are taking the medicine. Pregnancy  If you are about to stop having your period (premenopausal) and  you may become pregnant, seek counseling before you get pregnant.  Take 400 to 800 micrograms (mcg) of folic acid every day if you become pregnant.  Ask for birth control (contraception) if you want to prevent pregnancy. Osteoporosis and menopause Osteoporosis is a disease in which the bones lose minerals and strength with aging. This can result in bone fractures. If you are 65 years old or older, or if you are at risk for osteoporosis and fractures, ask your health care provider if you should:  Be screened for bone loss.  Take a calcium or vitamin D supplement to lower your risk of fractures.  Be given hormone replacement therapy (HRT) to treat symptoms of menopause. Follow these instructions at home: Lifestyle  Do not use any products that contain nicotine or tobacco, such as cigarettes, e-cigarettes, and chewing tobacco. If you need help quitting, ask your health care provider.  Do not use street drugs.  Do not share needles.  Ask your health care provider for help if you need support or information about quitting drugs. Alcohol use  Do not drink alcohol if: ? Your health care provider tells you not to drink. ? You are pregnant, may be pregnant, or are planning to become pregnant.  If you drink alcohol: ? Limit how much you use to 0-1 drink a day. ? Limit intake if you are breastfeeding.  Be aware of how much alcohol is in your drink. In the [place] one drink equals one 12 oz bottle of beer (355 mL), one 5 oz glass of wine (148 mL), or one 1 oz glass of hard liquor (44 mL). General instructions  Schedule regular health, dental, and eye exams.  Stay current with your vaccines.  Tell your health care provider if: ? You often feel depressed. ? You have ever been abused or do not feel safe at home. Summary  Adopting a healthy lifestyle and getting preventive care are important in promoting health and wellness.  Follow your health care provider's instructions about healthy  diet, exercising, and getting tested or screened for diseases.  Follow your health care provider's instructions on monitoring your cholesterol and blood pressure. This information is not intended to replace advice given to you by your health care provider. Make sure you discuss any questions you have with your health care provider. Document Revised: 01/26/2018 Document Reviewed: 01/26/2018 Elsevier Patient Education  2020 [employer]vier Inc.  

## 2020-02-07 NOTE — Progress Notes (Signed)
I,Katawbba Wiggins,acting as a Education administrator for Maximino Greenland, MD.,have documented all relevant documentation on the behalf of Maximino Greenland, MD,as directed by  Maximino Greenland, MD while in the presence of Maximino Greenland, MD.  This visit occurred during the SARS-CoV-2 public health emergency.  Safety protocols were in place, including screening questions prior to the visit, additional usage of staff PPE, and extensive cleaning of exam room while observing appropriate contact time as indicated for disinfecting solutions.  Subjective:     Patient ID: Ashley Dominguez , female    DOB: March 19, 1945 , 74 y.o.   MRN: [id number]   Chief Complaint  Patient presents with  . Annual Exam    HPI  The patient is here today for a physical examination. She has no specific concerns or complaints at this time.     Past Medical History:  Diagnosis Date  . Osteoporosis      Family History  Problem Relation Age of Onset  . Cancer Mother   . Alcohol abuse Father   . Diabetes Father   . Diabetes Brother   . Cancer Maternal Aunt   . Breast cancer Neg Hx      Current Outpatient Medications:  .  pneumococcal 13-valent conjugate vaccine (PREVNAR 13) SUSP injection, Inject 0.5 mLs into the muscle tomorrow at 10 am for 1 dose., Disp: 0.5 mL, Rfl: 0 .  Vitamin D, Ergocalciferol, (DRISDOL) 1.25 MG (50000 UNIT) CAPS capsule, One capsule po twice weekly on Tuesdays/Fridays, Disp: 24 capsule, Rfl: 0   No Known Allergies    The patient states she uses none for birth control. Last LMP was No LMP recorded. Patient is postmenopausal.. Negative for Dysmenorrhea. Negative for: breast discharge, breast lump(s), breast pain and breast self exam. Associated symptoms include abnormal vaginal bleeding. Pertinent negatives include abnormal bleeding (hematology), anxiety, decreased libido, depression, difficulty falling sleep, dyspareunia, history of infertility, nocturia, sexual dysfunction, sleep disturbances, urinary  incontinence, urinary urgency, vaginal discharge and vaginal itching. Diet regular.The patient states her exercise level is  intermittent.   . The patient's tobacco use is:  Social History   Tobacco Use  Smoking Status Never Smoker  Smokeless Tobacco Never Used  . She has been exposed to passive smoke. The patient's alcohol use is:  Social History   Substance and Sexual Activity  Alcohol Use Yes   Comment: 3-4 beers a day, liquor use 3 times a week    Review of Systems  Constitutional: Negative.   HENT: Negative.   Eyes: Negative.   Respiratory: Negative.   Cardiovascular: Negative.   Gastrointestinal: Negative.   Endocrine: Negative.   Genitourinary: Negative.   Musculoskeletal: Positive for neck pain.       She c/o neck pain. Has chronic neck pain. Admits she has not had massage in awhile. Denies UE weakness/paresthesias.   Skin: Negative.   Allergic/Immunologic: Negative.   Neurological: Negative.   Hematological: Negative.   Psychiatric/Behavioral: Negative.      Today's Vitals   02/07/20 1544  BP: 138/74  Temp: 97.9 F (36.6 C)  TempSrc: Oral  Weight: 123 lb 3.2 oz (55.9 kg)  Height: '5\' 2"'  (1.575 m)  PainSc: 0-No pain   Body mass index is 22.53 kg/m.   Objective:  Physical Exam Constitutional:      General: She is not in acute distress.    Appearance: Normal appearance. She is well-developed.  HENT:     Head: Normocephalic and atraumatic.     Right Ear: Hearing,  tympanic membrane, ear canal and external ear normal. There is no impacted cerumen.     Left Ear: Hearing, tympanic membrane, ear canal and external ear normal. There is no impacted cerumen.     Nose:     Comments: Deferred, masked    Mouth/Throat:     Comments: Deferred, masked Eyes:     General: Lids are normal.     Extraocular Movements: Extraocular movements intact.     Conjunctiva/sclera: Conjunctivae normal.     Pupils: Pupils are equal, round, and reactive to light.     Funduscopic  exam:    Right eye: No papilledema.        Left eye: No papilledema.  Neck:     Thyroid: No thyroid mass.     Vascular: No carotid bruit.  Cardiovascular:     Rate and Rhythm: Normal rate and regular rhythm.     Pulses: Normal pulses.     Heart sounds: Normal heart sounds. No murmur heard.   Pulmonary:     Effort: Pulmonary effort is normal.     Breath sounds: Normal breath sounds.  Chest:  Breasts:     Tanner Score is 5.     Right: Normal.     Left: Normal.    Abdominal:     General: Abdomen is flat. Bowel sounds are normal. There is no distension.     Palpations: Abdomen is soft.     Tenderness: There is no abdominal tenderness.  Genitourinary:    Comments: Deferred Musculoskeletal:        General: No swelling. Normal range of motion.     Cervical back: Full passive range of motion without pain, normal range of motion and neck supple. Tenderness present.     Right lower leg: No edema.     Left lower leg: No edema.  Skin:    General: Skin is warm and dry.     Capillary Refill: Capillary refill takes less than 2 seconds.  Neurological:     General: No focal deficit present.     Mental Status: She is alert and oriented to person, place, and time.     Cranial Nerves: No cranial nerve deficit.     Sensory: No sensory deficit.  Psychiatric:        Mood and Affect: Mood normal.        Behavior: Behavior normal.        Thought Content: Thought content normal.        Judgment: Judgment normal.         Assessment And Plan:     1. Encounter for general adult medical examination with abnormal findings Comments: A full exam was performed. Importance of monthly self breast exams was discussed with the patient. I will refer her for mammo and dexa scan. These will be scheduled at the [facility].  PATIENT IS ADVISED TO GET 30-45 MINUTES REGULAR EXERCISE NO LESS THAN FOUR TO FIVE DAYS PER WEEK - BOTH WEIGHTBEARING EXERCISES AND AEROBIC ARE RECOMMENDED.  PATIENT IS ADVISED TO  FOLLOW A HEALTHY DIET WITH AT LEAST SIX FRUITS/VEGGIES PER DAY, DECREASE INTAKE OF RED MEAT, AND TO INCREASE FISH INTAKE TO TWO DAYS PER WEEK.  MEATS/FISH SHOULD NOT BE FRIED, BAKED OR BROILED IS PREFERABLE.  I SUGGEST WEARING SPF 50 SUNSCREEN ON EXPOSED PARTS AND ESPECIALLY WHEN IN THE DIRECT SUNLIGHT FOR AN EXTENDED PERIOD OF TIME.  PLEASE AVOID FAST FOOD RESTAURANTS AND INCREASE YOUR WATER INTAKE. - Hepatitis C antibody - CBC -  CMP14+EGFR - Lipid panel  2. Cervicalgia Comments: Chronic. She is encouraged to resume therapeutic massages and to perform stretching exercises daily. She will be given rx for massage.   3. Vitamin D deficiency disease Comments: I will check vitamin D level and supplement as needed.  - VITAMIN D 25 Hydroxy (Vit-D Deficiency, Fractures)  4. Screen for colon cancer Comments: I will refer her to GI for CRC screening.  She prefers [facility] provider due to location.  - Ambulatory referral to Gastroenterology  5. Need for vaccination Comments: She was given Tdap to update her immunization history.  She was given rx for Prevnar-13. This was sent to her local pharmacy. - Tdap vaccine greater than or equal to 7yo IM  Patient was given opportunity to ask questions. Patient verbalized understanding of the plan and was able to repeat key elements of the plan. All questions were answered to their satisfaction.  Maximino Greenland, MD   I, Maximino Greenland, MD, have reviewed all documentation for this visit. The documentation on 02/11/20 for the exam, diagnosis, procedures, and orders are all accurate and complete.  THE PATIENT IS ENCOURAGED TO PRACTICE SOCIAL DISTANCING DUE TO THE COVID-19 PANDEMIC.

## 2020-02-08 ENCOUNTER — Telehealth: Payer: Self-pay

## 2020-02-08 LAB — CBC
Hematocrit: 40.1 % (ref 34.0–46.6)
Hemoglobin: 13.6 g/dL (ref 11.1–15.9)
MCH: 31.9 pg (ref 26.6–33.0)
MCHC: 33.9 g/dL (ref 31.5–35.7)
MCV: 94 fL (ref 79–97)
Platelets: 364 10*3/uL (ref 150–450)
RBC: 4.26 x10E6/uL (ref 3.77–5.28)
RDW: 11.7 % (ref 11.7–15.4)
WBC: 5.9 10*3/uL (ref 3.4–10.8)

## 2020-02-08 LAB — CMP14+EGFR
ALT: 12 IU/L (ref 0–32)
AST: 19 IU/L (ref 0–40)
Albumin/Globulin Ratio: 1.7 (ref 1.2–2.2)
Albumin: 4.7 g/dL (ref 3.7–4.7)
Alkaline Phosphatase: 73 IU/L (ref 44–121)
BUN/Creatinine Ratio: 11 — ABNORMAL LOW (ref 12–28)
BUN: 9 mg/dL (ref 8–27)
Bilirubin Total: 0.8 mg/dL (ref 0.0–1.2)
CO2: 24 mmol/L (ref 20–29)
Calcium: 10 mg/dL (ref 8.7–10.3)
Chloride: 98 mmol/L (ref 96–106)
Creatinine, Ser: 0.83 mg/dL (ref 0.57–1.00)
GFR calc Af Amer: 80 mL/min/{1.73_m2} (ref >59)
GFR calc non Af Amer: 70 mL/min/{1.73_m2} (ref >59)
Globulin, Total: 2.8 g/dL (ref 1.5–4.5)
Glucose: 94 mg/dL (ref 65–99)
Potassium: 4 mmol/L (ref 3.5–5.2)
Sodium: 138 mmol/L (ref 134–144)
Total Protein: 7.5 g/dL (ref 6.0–8.5)

## 2020-02-08 LAB — VITAMIN D 25 HYDROXY (VIT D DEFICIENCY, FRACTURES): Vit D, 25-Hydroxy: 20.9 ng/mL — ABNORMAL LOW (ref 30.0–100.0)

## 2020-02-08 LAB — HEPATITIS C ANTIBODY: Hep C Virus Ab: <0.1 s/co ratio (ref 0.0–0.9)

## 2020-02-08 LAB — LIPID PANEL
Chol/HDL Ratio: 2.2 ratio (ref 0.0–4.4)
Cholesterol, Total: 221 mg/dL — ABNORMAL HIGH (ref 100–199)
HDL: 101 mg/dL (ref >39)
LDL Chol Calc (NIH): 110 mg/dL — ABNORMAL HIGH (ref 0–99)
Triglycerides: 59 mg/dL (ref 0–149)
VLDL Cholesterol Cal: 10 mg/dL (ref 5–40)

## 2020-02-08 NOTE — Telephone Encounter (Signed)
Please write out rx

## 2020-02-08 NOTE — Telephone Encounter (Signed)
The pt said that she forgot to get her prescription for her massage and that she was told that she can have so that she can use her flex spend card. The pt said she will be back in January and that she will come by the office to pickup the rx then.

## 2020-02-09 ENCOUNTER — Other Ambulatory Visit: Payer: Self-pay | Admitting: Internal Medicine

## 2020-02-09 MED ORDER — VITAMIN D (ERGOCALCIFEROL) 1.25 MG (50000 UNIT) PO CAPS: ORAL_CAPSULE | ORAL | 0 refills | Status: DC

## 2020-02-11 MED ORDER — PNEUMOCOCCAL 13-VAL CONJ VACC IM SUSP: 0.5000 mL | INTRAMUSCULAR | 0 refills | Status: AC

## 2020-02-12 NOTE — Telephone Encounter (Signed)
Rx written out

## 2020-03-13 ENCOUNTER — Other Ambulatory Visit: Payer: Self-pay | Admitting: Internal Medicine

## 2020-03-13 DIAGNOSIS — E2839 Other primary ovarian failure: Secondary | ICD-10-CM

## 2020-03-13 DIAGNOSIS — Z0001 Encounter for general adult medical examination with abnormal findings: Secondary | ICD-10-CM

## 2020-04-02 ENCOUNTER — Telehealth (INDEPENDENT_AMBULATORY_CARE_PROVIDER_SITE_OTHER): Payer: 59 | Admitting: Internal Medicine

## 2020-04-02 ENCOUNTER — Encounter: Payer: Self-pay | Admitting: Internal Medicine

## 2020-04-02 VITALS — Ht 62.0 in

## 2020-04-02 DIAGNOSIS — M542 Cervicalgia: Secondary | ICD-10-CM

## 2020-04-02 DIAGNOSIS — G8929 Other chronic pain: Secondary | ICD-10-CM

## 2020-04-02 DIAGNOSIS — M25511 Pain in right shoulder: Secondary | ICD-10-CM

## 2020-04-02 NOTE — Patient Instructions (Signed)
Shoulder Pain Many things can cause shoulder pain, including:  An injury to the shoulder.  Overuse of the shoulder.  Arthritis. The source of the pain can be:  Inflammation.  An injury to the shoulder joint.  An injury to a tendon, ligament, or bone. Follow these instructions at home: Pay attention to changes in your symptoms. Let your health care provider know about them. Follow these instructions to relieve your pain. If you have a sling:  Wear the sling as told by your health care provider. Remove it only as told by your health care provider.  Loosen the sling if your fingers tingle, become numb, or turn cold and blue.  Keep the sling clean.  If the sling is not waterproof: ? Do not let it get wet. Remove it to shower or bathe.  Move your arm as little as possible, but keep your hand moving to prevent swelling. Managing pain, stiffness, and swelling  If directed, put ice on the painful area: ? Put ice in a plastic bag. ? Place a towel between your skin and the bag. ? Leave the ice on for 20 minutes, 2-3 times per day. Stop applying ice if it does not help with the pain.  Squeeze a soft ball or a foam pad as much as possible. This helps to keep the shoulder from swelling. It also helps to strengthen the arm.   General instructions  Take over-the-counter and prescription medicines only as told by your health care provider.  Keep all follow-up visits as told by your health care provider. This is important. Contact a health care provider if:  Your pain gets worse.  Your pain is not relieved with medicines.  New pain develops in your arm, hand, or fingers. Get help right away if:  Your arm, hand, or fingers: ? Tingle. ? Become numb. ? Become swollen. ? Become painful. ? Turn white or blue. Summary  Shoulder pain can be caused by an injury, overuse, or arthritis.  Pay attention to changes in your symptoms. Let your health care provider know about  them.  This condition may be treated with a sling, ice, and pain medicines.  Contact your health care provider if the pain gets worse or new pain develops. Get help right away if your arm, hand, or fingers tingle or become numb, swollen, or painful.  Keep all follow-up visits as told by your health care provider. This is important. This information is not intended to replace advice given to you by your health care provider. Make sure you discuss any questions you have with your health care provider. Document Revised: 08/17/2017 Document Reviewed: 08/17/2017 Elsevier Patient Education  2021 Elsevier Inc.  

## 2020-04-02 NOTE — Progress Notes (Signed)
Virtual Visit via Video   This visit type was conducted due to national recommendations for restrictions regarding the COVID-19 Pandemic (e.g. social distancing) in an effort to limit this patient's exposure and mitigate transmission in our community.  Due to her co-morbid illnesses, this patient is at least at moderate risk for complications without adequate follow up.  This format is felt to be most appropriate for this patient at this time.  All issues noted in this document were discussed and addressed.  A limited physical exam was performed with this format.    This visit type was conducted due to national recommendations for restrictions regarding the COVID-19 Pandemic (e.g. social distancing) in an effort to limit this patient's exposure and mitigate transmission in our community.  Patients identity confirmed using two different identifiers.  This format is felt to be most appropriate for this patient at this time.  All issues noted in this document were discussed and addressed.  No physical exam was performed (except for noted visual exam findings with Video Visits).    Date:  04/02/2020   ID:  Ashley Dominguez, Ashley Dominguez [birth year], MRN [id number]  Patient Location:  Home  Provider location:   Office    Chief Complaint:  "I have right shoulder pain"  History of Present Illness:    Ashley Dominguez is a 75 y.o. female who presents via video conferencing for a telehealth visit today.    The patient does not have symptoms concerning for COVID-19 infection (fever, chills, cough, or new shortness of breath).   She presents today for virtual visit. She prefers this method of contact due to COVID-19 pandemic. The patient would like a referral  for right shoulder pain. She reports her sx have worsened over the past year. She denies fall/trauma. Pain is exacerbated by movement.   Shoulder Pain  The pain is present in the right shoulder. This is a chronic problem. The current episode  started more than 1 year ago. The problem occurs daily. The problem has been gradually worsening. The quality of the pain is described as dull and aching. The pain is moderate. Pertinent negatives include no itching or limited range of motion. She has tried rest, heat and movement for the symptoms. The treatment provided moderate relief. There is no history of diabetes or gout.     Past Medical History:  Diagnosis Date  . Osteoporosis    Past Surgical History:  Procedure Laterality Date  . APPENDECTOMY    . cataract, unilateral N/A    she does not recall  . TONSILLECTOMY    . TUBAL LIGATION       No outpatient medications have been marked as taking for the 04/02/20 encounter (Video Visit) with Dorothyann Peng, MD.     Allergies:   Patient has no known allergies.   Social History   Tobacco Use  . Smoking status: Never Smoker  . Smokeless tobacco: Never Used  Vaping Use  . Vaping Use: Never used  Substance Use Topics  . Alcohol use: Yes    Comment: 3-4 beers a day, liquor use 3 times a week  . Drug use: Never     Family Hx: The patient's family history includes Alcohol abuse in her father; Cancer in her maternal aunt and mother; Diabetes in her brother and father. There is no history of Breast cancer.  ROS:   Please see the history of present illness.    Review of Systems  Constitutional: Negative.   Respiratory: Negative.  Cardiovascular: Negative.   Gastrointestinal: Negative.   Musculoskeletal: Positive for neck pain. Negative for gout.       Right shoulder pain  Skin: Negative for itching.  Neurological: Negative.   Psychiatric/Behavioral: Negative.     All other systems reviewed and are negative.   Labs/Other Tests and Data Reviewed:    Recent Labs: 02/07/2020: ALT 12; BUN 9; Creatinine, Ser 0.83; Hemoglobin 13.6; Platelets 364; Potassium 4.0; Sodium 138   Recent Lipid Panel Lab Results  Component Value Date/Time   CHOL 221 (H) 02/07/2020 05:13 PM    TRIG 59 02/07/2020 05:13 PM   HDL 101 02/07/2020 05:13 PM   CHOLHDL 2.2 02/07/2020 05:13 PM   LDLCALC 110 (H) 02/07/2020 05:13 PM    Wt Readings from Last 3 Encounters:  02/07/20 123 lb 3.2 oz (55.9 kg)  01/26/19 124 lb 12.8 oz (56.6 kg)  01/11/18 125 lb (56.7 kg)     Exam:    Vital Signs:  Ht 5\' 2"  (1.575 m)   BMI 22.53 kg/m     Physical Exam Vitals and nursing note reviewed.  HENT:     Head: Normocephalic and atraumatic.  Pulmonary:     Effort: Pulmonary effort is normal.  Musculoskeletal:     Comments: She has full ROM, but reports pain with movement  Neurological:     Mental Status: She is alert and oriented to person, place, and time.  Psychiatric:        Mood and Affect: Affect normal.     ASSESSMENT & PLAN:    1. Chronic right shoulder pain Comments: I will refer her to [facility] as requested. Advised to apply pain cream to affected areas bid-tid prn.  - Ambulatory referral to Orthopedic Surgery  2. Cervicalgia Comments: Chronic. I will mail her a rx for therapeutic massage.  She is encouraged to perform stretching exercises.   COVID-19 Education: The signs and symptoms of COVID-19 were discussed with the patient and how to seek care for testing (follow up with PCP or arrange E-visit).  The importance of social distancing was discussed today.  Patient Risk:   After full review of this patients clinical status, I feel that they are at least moderate risk at this time.  Time:   Today, I have spent 10 minutes/ seconds with the patient with telehealth technology discussing above diagnoses.     Medication Adjustments/Labs and Tests Ordered: Current medicines are reviewed at length with the patient today.  Concerns regarding medicines are outlined above.   Tests Ordered: Orders Placed This Encounter  Procedures  . Ambulatory referral to Orthopedic Surgery    Medication Changes: No orders of the defined types were placed in this  encounter.   Disposition:  Follow up prn  Signed, [facility], MD

## 2020-04-14 ENCOUNTER — Encounter: Payer: Self-pay | Admitting: Internal Medicine

## 2020-05-02 ENCOUNTER — Ambulatory Visit: Payer: 59

## 2020-05-06 LAB — HM COLONOSCOPY

## 2020-05-07 ENCOUNTER — Other Ambulatory Visit: Payer: Self-pay | Admitting: Internal Medicine

## 2020-05-07 ENCOUNTER — Ambulatory Visit
Admission: RE | Admit: 2020-05-07 | Discharge: 2020-05-07 | Disposition: A | Discharge status: Home / Self Care | Payer: 59 | Source: Ambulatory Visit | Attending: Internal Medicine | Admitting: Internal Medicine

## 2020-05-07 ENCOUNTER — Other Ambulatory Visit: Payer: Self-pay

## 2020-05-07 ENCOUNTER — Encounter: Payer: Self-pay | Admitting: Internal Medicine

## 2020-05-07 DIAGNOSIS — Z0001 Encounter for general adult medical examination with abnormal findings: Secondary | ICD-10-CM

## 2020-07-19 ENCOUNTER — Ambulatory Visit
Admission: RE | Admit: 2020-07-19 | Discharge: 2020-07-19 | Disposition: A | Discharge status: Home / Self Care | Payer: 59 | Source: Ambulatory Visit | Attending: Internal Medicine | Admitting: Internal Medicine

## 2020-07-19 ENCOUNTER — Other Ambulatory Visit: Payer: Self-pay

## 2020-07-19 DIAGNOSIS — E2839 Other primary ovarian failure: Secondary | ICD-10-CM

## 2021-04-23 IMAGING — MG MM DIGITAL SCREENING BILAT W/ TOMO AND CAD
8 series · 9 of 24 positions shown · non-contrast
Comparison: Previous exam(s).

CLINICAL DATA: Screening.

EXAM:
DIGITAL SCREENING BILATERAL MAMMOGRAM WITH TOMOSYNTHESIS AND CAD
TECHNIQUE: Bilateral screening digital craniocaudal and mediolateral oblique
mammograms were obtained. Bilateral screening digital breast
tomosynthesis was performed. The images were evaluated with
computer-aided detection.

[L MLO synth-2D]
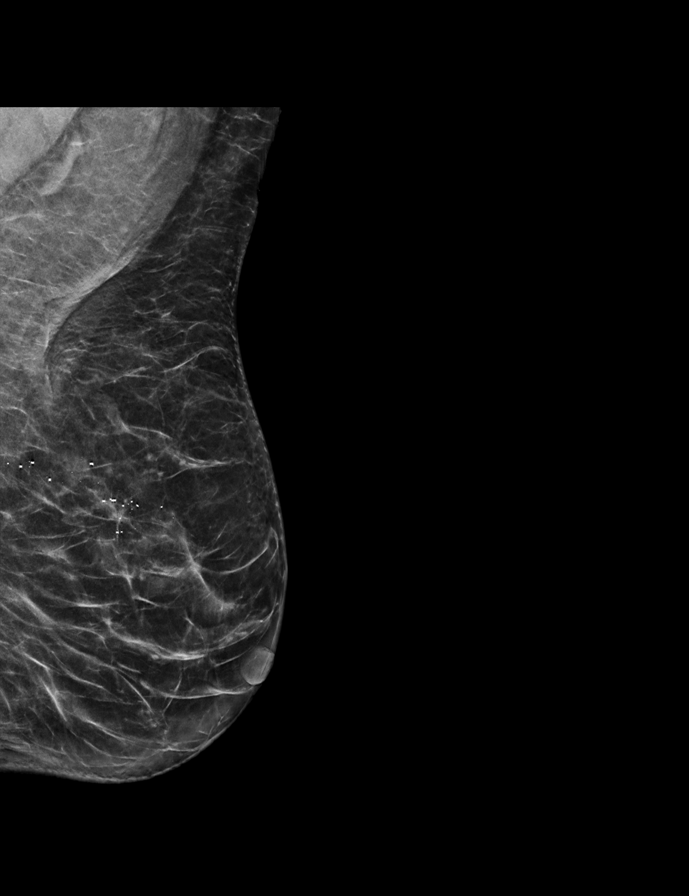

[L CC synth-2D]
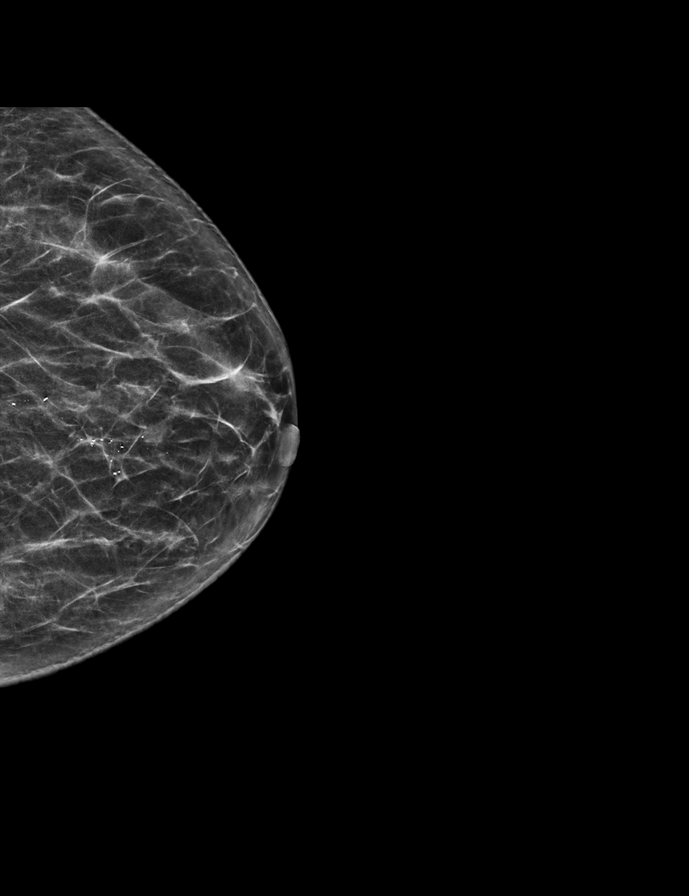

[R CC synth-2D]
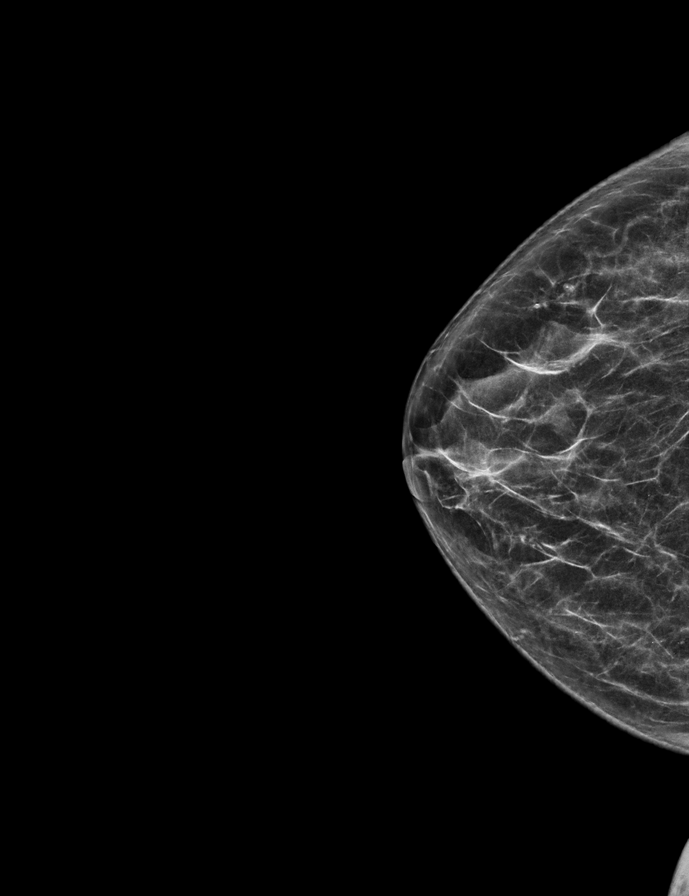

[R MLO synth-2D]
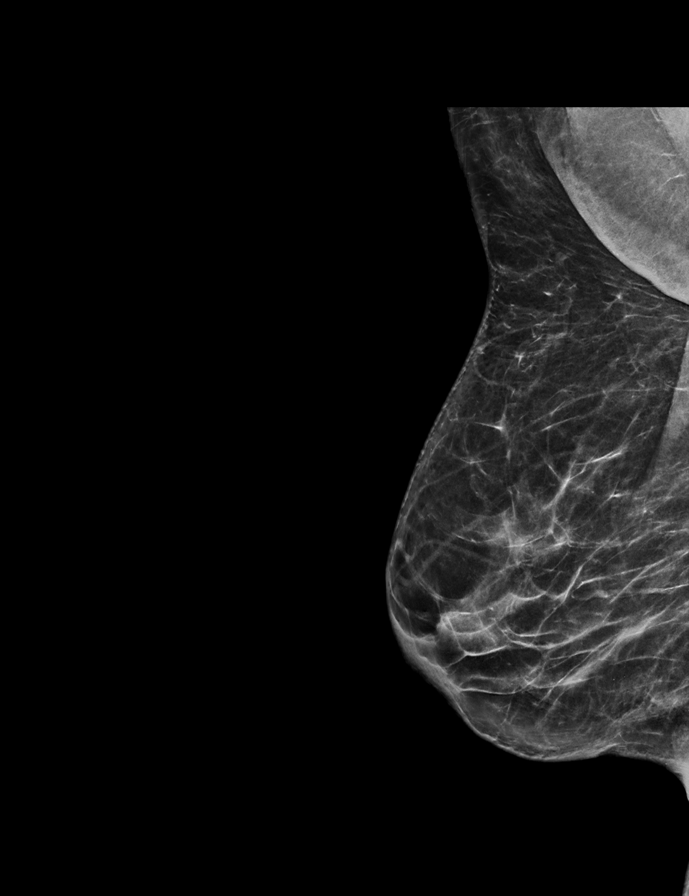

[L CC tomo · 2 of 56 frames shown]
[frame 19/56]
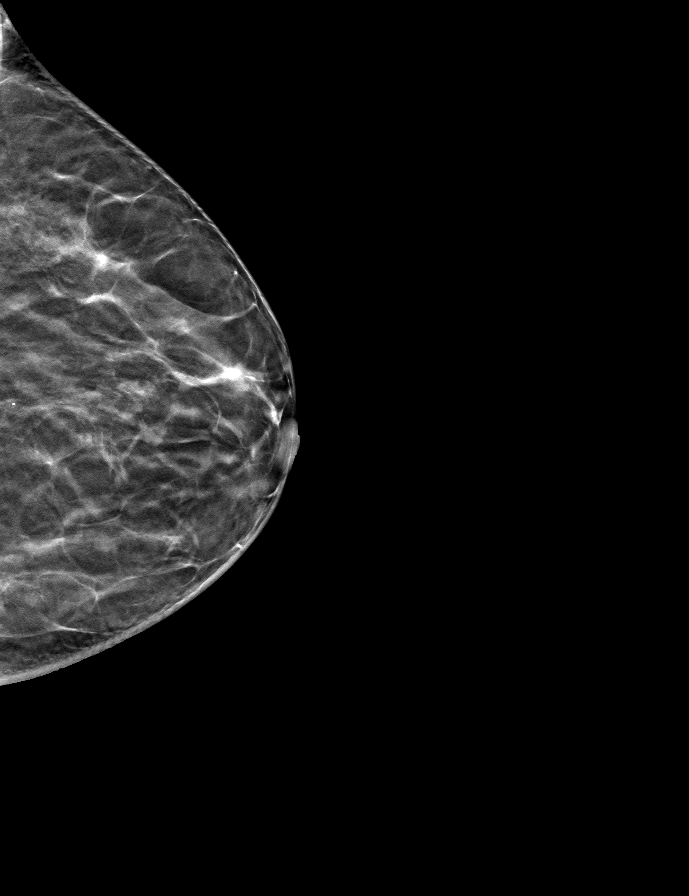
[frame 29/56]
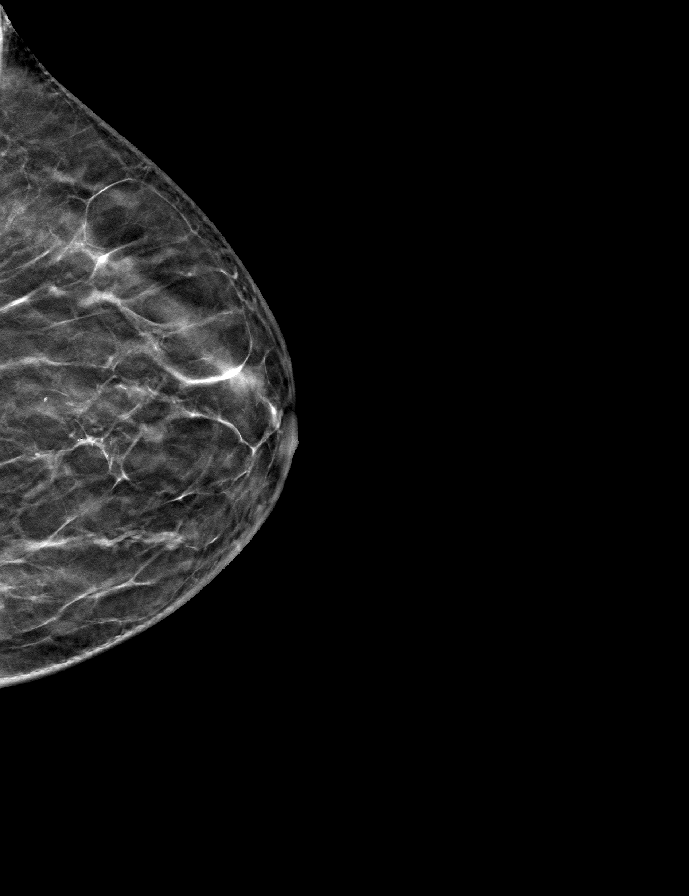

[R CC tomo · tomo slice 29/57.0]
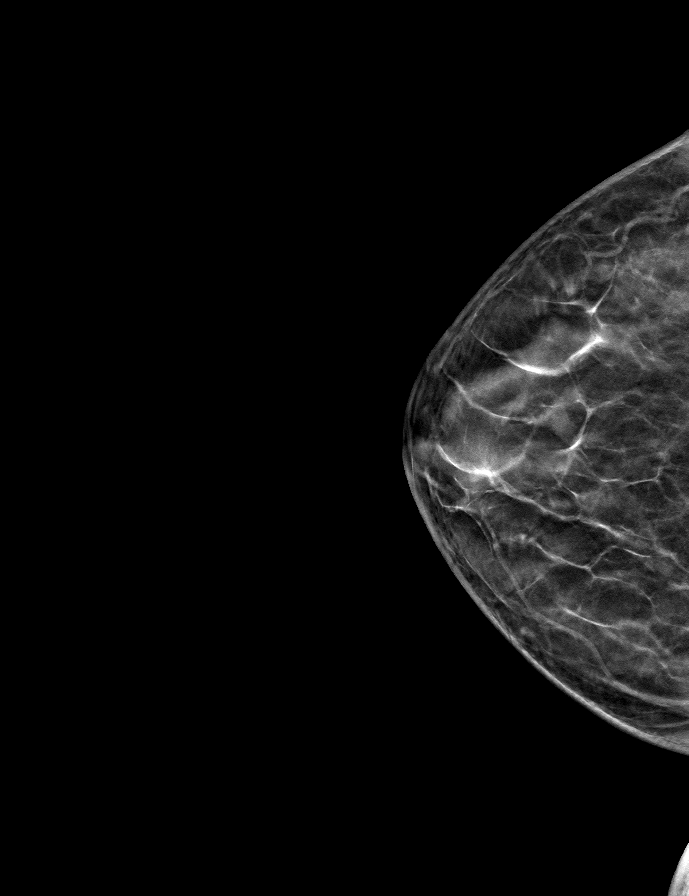

[L MLO tomo · tomo slice 31/62.0]
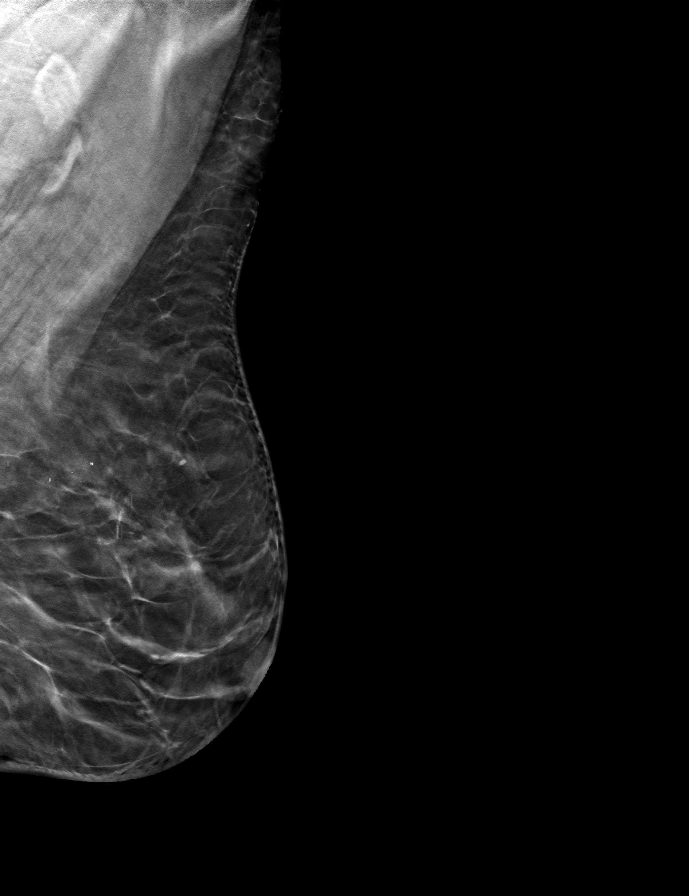

[R MLO tomo · tomo slice 33/64.0]
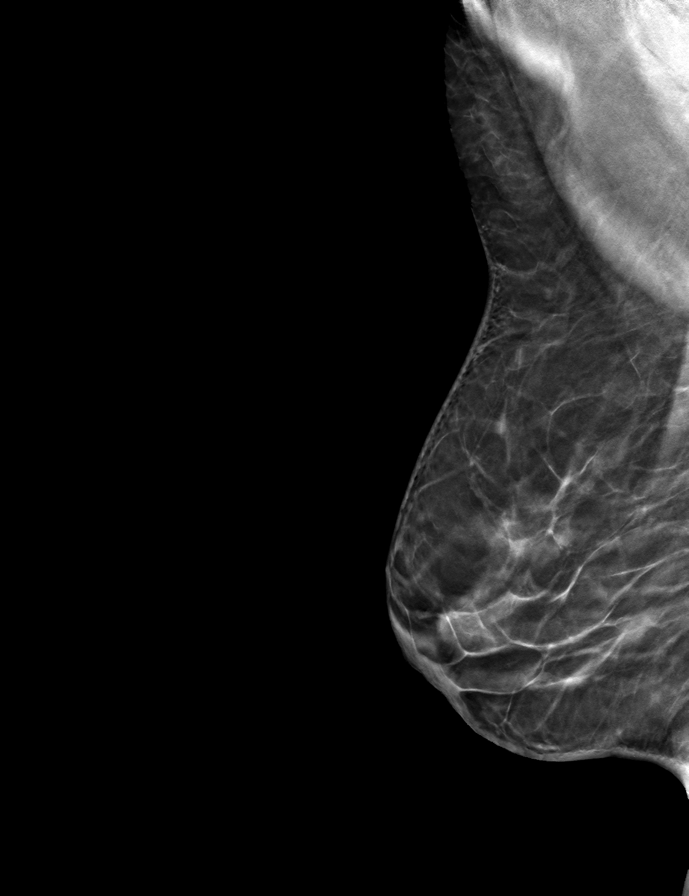

[9 of 24 positions shown; findings below may reference images not displayed]

ACR Breast Density Category b: There are scattered areas of
fibroglandular density.
FINDINGS: There are no findings suspicious for malignancy. The images were
evaluated with computer-aided detection.
IMPRESSION: No mammographic evidence of malignancy. A result letter of this
screening mammogram will be mailed directly to the patient.

RECOMMENDATION:
Screening mammogram in one year. (Code:WJ-I-BG6)

BI-RADS CATEGORY  1: Negative.

## 2021-05-08 ENCOUNTER — Other Ambulatory Visit: Payer: Self-pay

## 2021-05-08 ENCOUNTER — Telehealth (INDEPENDENT_AMBULATORY_CARE_PROVIDER_SITE_OTHER): Payer: 59 | Admitting: Nurse Practitioner

## 2021-05-08 DIAGNOSIS — Z23 Encounter for immunization: Secondary | ICD-10-CM

## 2021-05-08 DIAGNOSIS — R051 Acute cough: Secondary | ICD-10-CM | POA: Diagnosis not present

## 2021-05-08 DIAGNOSIS — U071 COVID-19: Secondary | ICD-10-CM | POA: Diagnosis not present

## 2021-05-08 MED ORDER — SHINGRIX 50 MCG/0.5ML IM SUSR: 0.5000 mL | Freq: Once | INTRAMUSCULAR | 0 refills | Status: AC

## 2021-05-08 MED ORDER — BENZONATATE 100 MG PO CAPS: 100.0000 mg | ORAL_CAPSULE | Freq: Four times a day (QID) | ORAL | 1 refills | Status: DC | PRN

## 2021-05-08 MED ORDER — MOLNUPIRAVIR EUA 200MG CAPSULE: 4.0000 | ORAL_CAPSULE | Freq: Two times a day (BID) | ORAL | 0 refills | Status: AC

## 2021-05-08 NOTE — Progress Notes (Signed)
? ?Virtual Visit via [place]   ? ?This visit type was conducted due to national recommendations for restrictions regarding the COVID-19 Pandemic (e.g. social distancing) in an effort to limit this patient's exposure and mitigate transmission in our community.  Due to her co-morbid illnesses, this patient is at least at moderate risk for complications without adequate follow up.  This format is felt to be most appropriate for this patient at this time.  All issues noted in this document were discussed and addressed.  A limited physical exam was performed with this format.   ? ?This visit type was conducted due to national recommendations for restrictions regarding the COVID-19 Pandemic (e.g. social distancing) in an effort to limit this patient's exposure and mitigate transmission in our community.  Patients identity confirmed using two different identifiers.  This format is felt to be most appropriate for this patient at this time.  All issues noted in this document were discussed and addressed.  No physical exam was performed (except for noted visual exam findings with Video Visits).   ? ?Date:  05/08/2021  ? ?ID:  JOHNY NAKAO, DOB 1945/09/26, MRN [id number] ? ?Patient Location:  ?Home - Spoke with Margaretmary Eddy ? ?Provider location:   ?Office ? ? ? ?Chief Complaint:  positive covid with home test ? ?History of Present Illness:   ? ?HOLY COMO is a 76 y.o. female who presents via video conferencing for a telehealth visit today.   ? ?The patient does have symptoms concerning for COVID-19 infection (fever, chills, cough, or new shortness of breath).  ? ?She tested positive for Covid on yesterday - aching, headache, dizzy and diarrhea. She stayed in the bed and did not go to bed. She had a virtual class at 1p, took a test and was positive with a home test. She has taken Naproxen and acetaminophen she took them together. She states "her husband said she felt like she had a fever"  ? ?She is coughing  today. She is not feeling as dizzy or aching. She has been vaccinated with boosters.  ? ?She has set up an appt with the [facility] in [place] for her travel [place]  ? ?  ? ?Past Medical History:  ?Diagnosis Date  ? Osteoporosis   ? ?Past Surgical History:  ?Procedure Laterality Date  ? APPENDECTOMY    ? cataract, unilateral N/A   ? she does not recall  ? TONSILLECTOMY    ? TUBAL LIGATION    ?  ? ?No outpatient medications have been marked as taking for the 05/08/21 encounter (Video Visit) with Minette Brine, [place].  ?  ? ?Allergies:   Patient has no known allergies.  ? ?Social History  ? ?Tobacco Use  ? Smoking status: Never  ? Smokeless tobacco: Never  ?Vaping Use  ? Vaping Use: Never used  ?Substance Use Topics  ? Alcohol use: Yes  ?  Comment: 3-4 beers a day, liquor use 3 times a week  ? Drug use: Never  ?  ? ?Family Hx: ?The patient's family history includes Alcohol abuse in her father; Cancer in her maternal aunt and mother; Diabetes in her brother and father. There is no history of Breast cancer. ? ?ROS:   ?Please see the history of present illness.    ?Review of Systems  ?Constitutional: Negative.    ?All other systems reviewed and are negative. ? ? ?Labs/Other Tests and Data Reviewed:   ? ?Recent Labs: ?No results found for requested  labs within last 8760 hours.  ? ?Recent Lipid Panel ?Lab Results  ?Component Value Date/Time  ? CHOL 221 (H) 02/07/2020 05:13 PM  ? TRIG 59 02/07/2020 05:13 PM  ? HDL 101 02/07/2020 05:13 PM  ? CHOLHDL 2.2 02/07/2020 05:13 PM  ? LDLCALC 110 (H) 02/07/2020 05:13 PM  ? ? ?Wt Readings from Last 3 Encounters:  ?02/07/20 123 lb 3.2 oz (55.9 kg)  ?01/26/19 124 lb 12.8 oz (56.6 kg)  ?01/11/18 125 lb (56.7 kg)  ?  ? ?Exam:   ? ?Vital Signs:  There were no vitals taken for this visit.  ? ? ?Physical Exam ?Vitals reviewed.  ?Constitutional:   ?   General: She is not in acute distress. ?   Appearance: Normal appearance.  ?Pulmonary:  ?   Effort: Pulmonary effort is  normal. No respiratory distress.  ?Neurological:  ?   General: No focal deficit present.  ?   Mental Status: She is alert and oriented to person, place, and time. Mental status is at baseline.  ?   Cranial Nerves: No cranial nerve deficit.  ?Psychiatric:     ?   Mood and Affect: Mood and affect normal.     ?   Behavior: Behavior normal.     ?   Thought Content: Thought content normal.     ?   Cognition and Memory: Memory normal.     ?   Judgment: Judgment normal.  ? ? ?ASSESSMENT & PLAN:   ? ?1. COVID-19 ?Advised patient to take Vitamin C, D, Zinc.  Keep yourself hydrated with a lot of water and rest. Take Delsym for cough and Mucinex as need. Take Tylenol or pain reliever every 4-6 hours as needed for pain/fever/body ache. If you have elevated blood pressure, you can take OTC Corcidin. You can also take OTC oscillococcinum to help with your symptoms.  Educated patient if symptoms get worse or if she experiences any SOB, chest pain or pain in her legs to seek immediate emergency care. Continue to monitor your oxygen levels. Call us if you have any questions. Quarantine for 5 days if tested positive and no symptoms or 10 days if tested positive and have symptoms. Wear a mask around other people.  ?- molnupiravir EUA (LAGEVRIO) 200 mg CAPS capsule; Take 4 capsules (800 mg total) by mouth 2 (two) times daily for 5 days.  Dispense: 40 capsule; Refill: 0 ? ?2. Acute cough ? ?- benzonatate (TESSALON PERLES) 100 MG capsule; Take 1 capsule (100 mg total) by mouth every 6 (six) hours as needed.  Dispense: 30 capsule; Refill: 1 ? ?3. Encounter for immunization ?She is to get her shingrix done once she is feeling better before her trip out of the country.  ? ? ?COVID-19 Education: ?The signs and symptoms of COVID-19 were discussed with the patient and how to seek care for testing (follow up with PCP or arrange E-visit).  The importance of social distancing was discussed today. ? ?Patient Risk:   ?After full review of this  patients clinical status, I feel that they are at least moderate risk at this time. ? ?Time:   ?Today, I have spent 17.00 minutes/ seconds with the patient with telehealth technology discussing above diagnoses.   ? ? ?Medication Adjustments/Labs and Tests Ordered: ?Current medicines are reviewed at length with the patient today.  Concerns regarding medicines are outlined above.  ? ?Tests Ordered: ?No orders of the defined types were placed in this encounter. ? ? ?Medication Changes: ?No  orders of the defined types were placed in this encounter. ? ? ?Disposition:  Follow up prn ? ?Signed, ?[place], CMA  ?  ?

## 2021-05-08 NOTE — Patient Instructions (Signed)
COVID-19: Quarantine and Isolation ?Quarantine ?If you were exposed ?Quarantine and stay away from others when you have been in close contact with someone who has COVID-19. ?Isolate ?If you are sick or test positive ?Isolate when you are sick or when you have COVID-19, even if you don't have symptoms. ?When to stay home ?Calculating quarantine ?The date of your exposure is considered day 0. Day 1 is the first full day after your last contact with a person who has had COVID-19. Stay home and away from other people for at least 5 days. Learn why CDC updated guidance for the general public. ?IF YOU were exposed to COVID-19 and are NOT  ?up to dateIF YOU were exposed to COVID-19 and are NOT on COVID-19 vaccinations ?Quarantine for at least 5 days ?Stay home ?Stay home and quarantine for at least 5 full days. ?Wear a well-fitting mask if you must be around others in your home. ?Do not travel. ?Get tested ?Even if you don't develop symptoms, get tested at least 5 days after you last had close contact with someone with COVID-19. ?After quarantine ?Watch for symptoms ?Watch for symptoms until 10 days after you last had close contact with someone with COVID-19. ?Avoid travel ?It is best to avoid travel until a full 10 days after you last had close contact with someone with COVID-19. ?If you develop symptoms ?Isolate immediately and get tested. Continue to stay home until you know the results. Wear a well-fitting mask around others. ?Take precautions until day 10 ?Wear a well-fitting mask ?Wear a well-fitting mask for 10 full days any time you are around others inside your home or in public. Do not go to places where you are unable to wear a well-fitting mask. ?If you must travel during days 6-10, take precautions. ?Avoid being around people who are more likely to get very sick from COVID-19. ?IF YOU were exposed to COVID-19 and are  ?up to dateIF YOU were exposed to COVID-19 and are on COVID-19 vaccinations ?No  quarantine ?You do not need to stay home unless you develop symptoms. ?Get tested ?Even if you don't develop symptoms, get tested at least 5 days after you last had close contact with someone with COVID-19. ?Watch for symptoms ?Watch for symptoms until 10 days after you last had close contact with someone with COVID-19. ?If you develop symptoms ?Isolate immediately and get tested. Continue to stay home until you know the results. Wear a well-fitting mask around others. ?Take precautions until day 10 ?Wear a well-fitting mask ?Wear a well-fitting mask for 10 full days any time you are around others inside your home or in public. Do not go to places where you are unable to wear a well-fitting mask. ?Take precautions if traveling ?Avoid being around people who are more likely to get very sick from COVID-19. ?IF YOU were exposed to COVID-19 and had confirmed COVID-19 within the past 90 days (you tested positive using a viral test) ?No quarantine ?You do not need to stay home unless you develop symptoms. ?Watch for symptoms ?Watch for symptoms until 10 days after you last had close contact with someone with COVID-19. ?If you develop symptoms ?Isolate immediately and get tested. Continue to stay home until you know the results. Wear a well-fitting mask around others. ?Take precautions until day 10 ?Wear a well-fitting mask ?Wear a well-fitting mask for 10 full days any time you are around others inside your home or in public. Do not go to places where you are   unable to wear a well-fitting mask. ?Take precautions if traveling ?Avoid being around people who are more likely to get very sick from COVID-19. ?Calculating isolation ?Day 0 is your first day of symptoms or a positive viral test. Day 1 is the first full day after your symptoms developed or your test specimen was collected. If you have COVID-19 or have symptoms, isolate for at least 5 days. ?IF YOU tested positive for COVID-19 or have symptoms, regardless of  vaccination status ?Stay home for at least 5 days ?Stay home for 5 days and isolate from others in your home. ?Wear a well-fitting mask if you must be around others in your home. ?Do not travel. ?Ending isolation if you had symptoms ?End isolation after 5 full days if you are fever-free for 24 hours (without the use of fever-reducing medication) and your symptoms are improving. ?Ending isolation if you did NOT have symptoms ?End isolation after at least 5 full days after your positive test. ?If you got very sick from COVID-19 or have a weakened immune system ?You should isolate for at least 10 days. Consult your doctor before ending isolation. ?Take precautions until day 10 ?Wear a well-fitting mask ?Wear a well-fitting mask for 10 full days any time you are around others inside your home or in public. Do not go to places where you are unable to wear a well-fitting mask. ?Do not travel ?Do not travel until a full 10 days after your symptoms started or the date your positive test was taken if you had no symptoms. ?Avoid being around people who are more likely to get very sick from COVID-19. ?Definitions ?Exposure ?Contact with someone infected with SARS-CoV-2, the virus that causes COVID-19, in a way that increases the likelihood of getting infected with the virus. ?Close contact ?A close contact is someone who was less than 6 feet away from an infected person (laboratory-confirmed or a clinical diagnosis) for a cumulative total of 15 minutes or more over a 24-hour period. For example, three individual 5-minute exposures for a total of 15 minutes. People who are exposed to someone with COVID-19 after they completed at least 5 days of isolation are not considered close contacts. ?Quarantine ?Quarantine is a strategy used to prevent transmission of COVID-19 by keeping people who have been in close contact with someone with COVID-19 apart from others. ?Who does not need to quarantine? ?If you had close contact with  someone with COVID-19 and you are in one of the following groups, you do not need to quarantine. ?You are up to date with your COVID-19 vaccines. ?You had confirmed COVID-19 within the last 90 days (meaning you tested positive using a viral test). ?If you are up to date with COVID-19 vaccines, you should wear a well-fitting mask around others for 10 days from the date of your last close contact with someone with COVID-19 (the date of last close contact is considered day 0). Get tested at least 5 days after you last had close contact with someone with COVID-19. If you test positive or develop COVID-19 symptoms, isolate from other people and follow recommendations in the Isolation section below. If you tested positive for COVID-19 with a viral test within the previous 90 days and subsequently recovered and remain without COVID-19 symptoms, you do not need to quarantine or get tested after close contact. You should wear a well-fitting mask around others for 10 days from the date of your last close contact with someone with COVID-19 (the date of last   close contact is considered day 0). If you have COVID-19 symptoms, get tested and isolate from other people and follow recommendations in the Isolation section below. ?Who should quarantine? ?If you come into close contact with someone with COVID-19, you should quarantine if you are not up to date on COVID-19 vaccines. This includes people who are not vaccinated. ?What to do for quarantine ?Stay home and away from other people for at least 5 days (day 0 through day 5) after your last contact with a person who has COVID-19. The date of your exposure is considered day 0. Wear a well-fitting mask when around others at home, if possible. ?For 10 days after your last close contact with someone with COVID-19, watch for fever (100.4?F or greater), cough, shortness of breath, or other COVID-19 symptoms. ?If you develop symptoms, get tested immediately and isolate until you receive  your test results. If you test positive, follow isolation recommendations. ?If you do not develop symptoms, get tested at least 5 days after you last had close contact with someone with COVID-19. ?If you test negative, you c

## 2021-05-19 ENCOUNTER — Encounter: Payer: Self-pay | Admitting: Nurse Practitioner

## 2021-05-22 ENCOUNTER — Encounter: Payer: 59 | Admitting: Internal Medicine

## 2021-06-23 ENCOUNTER — Encounter: Payer: Self-pay | Admitting: Internal Medicine

## 2021-07-29 ENCOUNTER — Ambulatory Visit (INDEPENDENT_AMBULATORY_CARE_PROVIDER_SITE_OTHER): Payer: 59 | Admitting: Internal Medicine

## 2021-07-29 ENCOUNTER — Encounter: Payer: Self-pay | Admitting: Internal Medicine

## 2021-07-29 VITALS — BP 112/60 | HR 66 | Temp 98.3°F | Ht 62.0 in | Wt 122.0 lb

## 2021-07-29 DIAGNOSIS — W19XXXA Unspecified fall, initial encounter: Secondary | ICD-10-CM | POA: Diagnosis not present

## 2021-07-29 DIAGNOSIS — Z0001 Encounter for general adult medical examination with abnormal findings: Secondary | ICD-10-CM

## 2021-07-29 DIAGNOSIS — H6122 Impacted cerumen, left ear: Secondary | ICD-10-CM | POA: Diagnosis not present

## 2021-07-29 DIAGNOSIS — E559 Vitamin D deficiency, unspecified: Secondary | ICD-10-CM | POA: Diagnosis not present

## 2021-07-29 DIAGNOSIS — W108XXS Fall (on) (from) other stairs and steps, sequela: Secondary | ICD-10-CM | POA: Insufficient documentation

## 2021-07-29 DIAGNOSIS — Z1231 Encounter for screening mammogram for malignant neoplasm of breast: Secondary | ICD-10-CM

## 2021-07-29 DIAGNOSIS — Z23 Encounter for immunization: Secondary | ICD-10-CM

## 2021-07-29 NOTE — Progress Notes (Unsigned)
Rich Brave [place]leton,acting as a Education administrator for Maximino Greenland, MD.,have documented all relevant documentation on the behalf of Maximino Greenland, MD,as directed by  Maximino Greenland, MD while in the presence of Maximino Greenland, MD.  This visit occurred during the SARS-CoV-2 public health emergency.  Safety protocols were in place, including screening questions prior to the visit, additional usage of staff PPE, and extensive cleaning of exam room while observing appropriate contact time as indicated for disinfecting solutions.  Subjective:     Patient ID: Ashley Dominguez , female    DOB: 10-05-1945 , 76 y.o.   MRN: [id number]   Chief Complaint  Patient presents with   Annual Exam    HPI  The patient is here today for a physical examination. She has no specific concerns or complaints at this time. She denies headaches, chest pain and shortness of breath. She is still working at [employer], does not plan to retire in the near future.      Past Medical History:  Diagnosis Date   Osteoporosis      Family History  Problem Relation Age of Onset   Cancer Mother    Alcohol abuse Father    Diabetes Father    Diabetes Brother    Cancer Maternal Aunt    Breast cancer Neg Hx      Current Outpatient Medications:    benzonatate (TESSALON PERLES) 100 MG capsule, Take 1 capsule (100 mg total) by mouth every 6 (six) hours as needed. (Patient not taking: Reported on 07/29/2021), Disp: 30 capsule, Rfl: 1   Cholecalciferol 125 MCG (5000 UT) TABS, Take by mouth. (Patient not taking: Reported on 05/08/2021), Disp: , Rfl:    No Known Allergies    The patient states she uses post menopausal status for birth control. Last LMP was No LMP recorded. Patient is postmenopausal.. Negative for Dysmenorrhea. Negative for: breast discharge, breast lump(s), breast pain and breast self exam. Associated symptoms include abnormal vaginal bleeding. Pertinent negatives include abnormal bleeding (hematology), anxiety,  decreased libido, depression, difficulty falling sleep, dyspareunia, history of infertility, nocturia, sexual dysfunction, sleep disturbances, urinary incontinence, urinary urgency, vaginal discharge and vaginal itching. Diet regular.The patient states her exercise level is Current Exercise Habits: Home exercise routine, Type of exercise: walking, Time (Minutes): 30, Frequency (Times/Week): 5, Weekly Exercise (Minutes/Week): 150, Intensity: Moderate Exercise limited by: None identified. The patient's tobacco use is:  Social History   Tobacco Use  Smoking Status Never  Smokeless Tobacco Never  . She has been exposed to passive smoke. The patient's alcohol use is:  Social History   Substance and Sexual Activity  Alcohol Use Yes   Comment: 3-4 beers a day, liquor use 3 times a week    Review of Systems  Constitutional: Negative.   HENT: Negative.    Eyes: Negative.   Respiratory: Negative.    Cardiovascular: Negative.   Gastrointestinal: Negative.   Endocrine: Negative.   Genitourinary: Negative.   Musculoskeletal:  Positive for neck pain.       She admits to having a fall about a month ago, she does not recall the exact date. States she tripped over something and fell and hit her head on the bathtub. She did not lose consciousness. She admits that the walkway may have been cluttered. Denies lingering headache or visual disturbance.   Also with neck pain, gets regular massages. Denies UE weakness/paresthesias.   Skin: Negative.   Allergic/Immunologic: Negative.   Neurological: Negative.   Hematological: Negative.  Psychiatric/Behavioral: Negative.       Today's Vitals   07/29/21 1424  BP: 112/60  Pulse: 66  Temp: 98.3 F (36.8 C)  Weight: 122 lb (55.3 kg)  Height: '5\' 2"'  (1.575 m)  PainSc: 0-No pain   Body mass index is 22.31 kg/m. Wt Readings from Last 3 Encounters:  07/29/21 122 lb (55.3 kg)  02/07/20 123 lb 3.2 oz (55.9 kg)  01/26/19 124 lb 12.8 oz (56.6 kg)       Objective:  Physical Exam Vitals and nursing note reviewed.  Constitutional:      Appearance: Normal appearance.  HENT:     Head: Normocephalic and atraumatic.     Right Ear: Tympanic membrane, ear canal and external ear normal.     Left Ear: Ear canal and external ear normal. There is impacted cerumen.     Nose: Nose normal.     Mouth/Throat:     Mouth: Mucous membranes are moist.     Pharynx: Oropharynx is clear.  Eyes:     Extraocular Movements: Extraocular movements intact.     Conjunctiva/sclera: Conjunctivae normal.     Pupils: Pupils are equal, round, and reactive to light.  Cardiovascular:     Rate and Rhythm: Normal rate and regular rhythm.     Pulses: Normal pulses.     Heart sounds: Normal heart sounds.  Pulmonary:     Effort: Pulmonary effort is normal.     Breath sounds: Normal breath sounds.  Chest:  Breasts:    Tanner Score is 5.     Right: Normal.     Left: Normal.  Abdominal:     General: Abdomen is flat. Bowel sounds are normal.     Palpations: Abdomen is soft.  Genitourinary:    Comments: deferred Musculoskeletal:        General: Normal range of motion.     Cervical back: Normal range of motion and neck supple.  Skin:    General: Skin is warm and dry.  Neurological:     General: No focal deficit present.     Mental Status: She is alert and oriented to person, place, and time.  Psychiatric:        Mood and Affect: Mood normal.        Behavior: Behavior normal.      Assessment And Plan:     1. Encounter for general adult medical examination with abnormal findings Comments: A full exam was performed. Importance of monthly self breast exams was discussed with the patient. PATIENT IS ADVISED TO GET 30-45 MINUTES REGULAR EXERCISE NO LESS THAN FOUR TO FIVE DAYS PER WEEK - BOTH WEIGHTBEARING EXERCISES AND AEROBIC ARE RECOMMENDED.  PATIENT IS ADVISED TO FOLLOW A HEALTHY DIET WITH AT LEAST SIX FRUITS/VEGGIES PER DAY, DECREASE INTAKE OF RED MEAT, AND TO  INCREASE FISH INTAKE TO TWO DAYS PER WEEK.  MEATS/FISH SHOULD NOT BE FRIED, BAKED OR BROILED IS PREFERABLE.  IT IS ALSO IMPORTANT TO CUT BACK ON YOUR SUGAR INTAKE. PLEASE AVOID ANYTHING WITH ADDED SUGAR, CORN SYRUP OR OTHER SWEETENERS. IF YOU MUST USE A SWEETENER, YOU CAN TRY STEVIA. IT IS ALSO IMPORTANT TO AVOID ARTIFICIALLY SWEETENERS AND DIET BEVERAGES. LASTLY, I SUGGEST WEARING SPF 50 SUNSCREEN ON EXPOSED PARTS AND ESPECIALLY WHEN IN THE DIRECT SUNLIGHT FOR AN EXTENDED PERIOD OF TIME.  PLEASE AVOID FAST FOOD RESTAURANTS AND INCREASE YOUR WATER INTAKE.  - POCT Urinalysis Dipstick (81002) - Microalbumin / creatinine urine ratio - CMP14+EGFR - CBC - Lipid panel - Insulin,  random(561)  2. Impacted cerumen of left ear Comments: AFTER OBTAINING VERBAL CONSENT, L EAR WAS FLUSHED BY IRRIGATION. SHE TOLERATED PROCEDURE WELL WITHOUT ANY COMPLICATIONS. NO TM ABNORMALITIES WERE NOTED. - Ear Lavage  3. Fall, initial encounter Comments: Occurred a month ago, unsure of date. Encouraged to clear walkways to decrease risk of falls.   4. Vitamin D deficiency disease Comments: I will check a vitamin D level and supplement as needed.  - Vitamin D (25 hydroxy)  5. Encounter for screening mammogram for malignant neoplasm of breast Comments: I will refer her to the [facility] for mammogram, she prefers Tuesdays. Also reminded to perform monthly SBE.  - MM DIGITAL SCREENING BILATERAL; Future  6. Immunization due - Varicella-zoster vaccine IM (Shingrix)  Patient was given opportunity to ask questions. Patient verbalized understanding of the plan and was able to repeat key elements of the plan. All questions were answered to their satisfaction.   I, Maximino Greenland, MD, have reviewed all documentation for this visit. The documentation on 07/29/21 for the exam, diagnosis, procedures, and orders are all accurate and complete.   THE PATIENT IS ENCOURAGED TO PRACTICE SOCIAL DISTANCING DUE TO THE COVID-19  PANDEMIC.

## 2021-07-29 NOTE — Patient Instructions (Signed)

## 2021-07-30 LAB — MICROALBUMIN / CREATININE URINE RATIO
Creatinine, Urine: 199.2 mg/dL
Microalb/Creat Ratio: 7 mg/g creat (ref 0–29)
Microalbumin, Urine: 14.3 ug/mL

## 2021-07-31 LAB — LIPID PANEL
Chol/HDL Ratio: 2.4 ratio (ref 0.0–4.4)
Cholesterol, Total: 204 mg/dL — ABNORMAL HIGH (ref 100–199)
HDL: 84 mg/dL (ref >39)
LDL Chol Calc (NIH): 105 mg/dL — ABNORMAL HIGH (ref 0–99)
Triglycerides: 84 mg/dL (ref 0–149)
VLDL Cholesterol Cal: 15 mg/dL (ref 5–40)

## 2021-07-31 LAB — VITAMIN D 25 HYDROXY (VIT D DEFICIENCY, FRACTURES): Vit D, 25-Hydroxy: 21.4 ng/mL — ABNORMAL LOW (ref 30.0–100.0)

## 2021-07-31 LAB — INSULIN, RANDOM: INSULIN: 4.2 u[IU]/mL (ref 2.6–24.9)

## 2021-07-31 LAB — CMP14+EGFR
ALT: 11 IU/L (ref 0–32)
AST: 16 IU/L (ref 0–40)
Albumin/Globulin Ratio: 1.8 (ref 1.2–2.2)
Albumin: 4.4 g/dL (ref 3.7–4.7)
Alkaline Phosphatase: 72 IU/L (ref 44–121)
BUN/Creatinine Ratio: 12 (ref 12–28)
BUN: 9 mg/dL (ref 8–27)
Bilirubin Total: 0.6 mg/dL (ref 0.0–1.2)
CO2: 25 mmol/L (ref 20–29)
Calcium: 9.7 mg/dL (ref 8.7–10.3)
Chloride: 102 mmol/L (ref 96–106)
Creatinine, Ser: 0.75 mg/dL (ref 0.57–1.00)
Globulin, Total: 2.5 g/dL (ref 1.5–4.5)
Glucose: 102 mg/dL — ABNORMAL HIGH (ref 70–99)
Potassium: 4.1 mmol/L (ref 3.5–5.2)
Sodium: 141 mmol/L (ref 134–144)
Total Protein: 6.9 g/dL (ref 6.0–8.5)
eGFR: 82 mL/min/{1.73_m2} (ref >59)

## 2021-07-31 LAB — CBC
Hematocrit: 40.6 % (ref 34.0–46.6)
Hemoglobin: 13.3 g/dL (ref 11.1–15.9)
MCH: 30.6 pg (ref 26.6–33.0)
MCHC: 32.8 g/dL (ref 31.5–35.7)
MCV: 94 fL (ref 79–97)
Platelets: 388 10*3/uL (ref 150–450)
RBC: 4.34 x10E6/uL (ref 3.77–5.28)
RDW: 12.2 % (ref 11.7–15.4)
WBC: 4.1 10*3/uL (ref 3.4–10.8)

## 2021-11-06 ENCOUNTER — Ambulatory Visit (INDEPENDENT_AMBULATORY_CARE_PROVIDER_SITE_OTHER): Payer: 59

## 2021-11-06 ENCOUNTER — Other Ambulatory Visit: Payer: Self-pay

## 2021-11-06 VITALS — BP 138/60 | HR 75 | Temp 98.4°F | Ht 62.0 in | Wt 122.0 lb

## 2021-11-06 DIAGNOSIS — Z23 Encounter for immunization: Secondary | ICD-10-CM | POA: Diagnosis not present

## 2021-11-06 NOTE — Progress Notes (Signed)
Patient presents today for second shingrix shot.

## 2021-11-11 ENCOUNTER — Ambulatory Visit: Payer: Medicare Other

## 2022-08-04 ENCOUNTER — Encounter: Payer: 59 | Admitting: Internal Medicine

## 2022-08-26 ENCOUNTER — Ambulatory Visit (INDEPENDENT_AMBULATORY_CARE_PROVIDER_SITE_OTHER): Payer: 59 | Admitting: Internal Medicine

## 2022-08-26 ENCOUNTER — Encounter: Payer: Self-pay | Admitting: Internal Medicine

## 2022-08-26 VITALS — BP 124/80 | HR 75 | Temp 98.3°F | Ht 62.0 in | Wt 117.0 lb

## 2022-08-26 DIAGNOSIS — W108XXS Fall (on) (from) other stairs and steps, sequela: Secondary | ICD-10-CM | POA: Diagnosis not present

## 2022-08-26 DIAGNOSIS — M25511 Pain in right shoulder: Secondary | ICD-10-CM

## 2022-08-26 DIAGNOSIS — Z0001 Encounter for general adult medical examination with abnormal findings: Secondary | ICD-10-CM | POA: Diagnosis not present

## 2022-08-26 DIAGNOSIS — R9431 Abnormal electrocardiogram [ECG] [EKG]: Secondary | ICD-10-CM

## 2022-08-26 DIAGNOSIS — E2839 Other primary ovarian failure: Secondary | ICD-10-CM

## 2022-08-26 NOTE — Progress Notes (Signed)
I,Victoria T Deloria Lair, CMA,acting as a Neurosurgeon for Gwynneth Aliment, MD.,have documented all relevant documentation on the behalf of Gwynneth Aliment, MD,as directed by  Gwynneth Aliment, MD while in the presence of Gwynneth Aliment, MD.  Subjective:    Patient ID: Ashley Dominguez , female    DOB: [birth year] , 77 y.o.   MRN: [id number]  Chief Complaint  Patient presents with   Annual Exam    HPI  The patient is here today for a physical examination. She has no specific concerns or complaints at this time. She denies headaches, chest pain and shortness of breath.   She reports wanting to complete EKG today because of previous abnormal EKGs.  She walks regularly and does not have any issues with physical activity.       Past Medical History:  Diagnosis Date   Osteoporosis      Family History  Problem Relation Age of Onset   Cancer Mother    Alcohol abuse Father    Diabetes Father    Diabetes Brother    Cancer Maternal Aunt    Breast cancer Neg Hx     No current outpatient medications on file.   No Known Allergies    The patient states she uses post menopausal status for birth control. No LMP recorded. Patient is postmenopausal.. Negative for Dysmenorrhea. Negative for: breast discharge, breast lump(s), breast pain and breast self exam. Associated symptoms include abnormal vaginal bleeding. Pertinent negatives include abnormal bleeding (hematology), anxiety, decreased libido, depression, difficulty falling sleep, dyspareunia, history of infertility, nocturia, sexual dysfunction, sleep disturbances, urinary incontinence, urinary urgency, vaginal discharge and vaginal itching. Diet regular.The patient states her exercise level is    . The patient's tobacco use is:  Social History   Tobacco Use  Smoking Status Never  Smokeless Tobacco Never  . She has been exposed to passive smoke. The patient's alcohol use is:  Social History   Substance and Sexual Activity  Alcohol Use Yes    Comment: 3-4 beers a day, liquor use 3 times a week    Review of Systems  Constitutional: Negative.   HENT: Negative.    Eyes: Negative.   Respiratory: Negative.    Cardiovascular: Negative.   Gastrointestinal: Negative.   Endocrine: Negative.   Genitourinary: Negative.   Musculoskeletal:  Positive for arthralgias.       She c/o fall. States it occurred on 6/24. She fell when walking into her home; she tripped over some boxes. She fell off of her porch into her yard (2 steps).  She fell onto her right shoulder. Initially w/ pain -sx have improved. Initially had decreased ROM due to pain, she is now having improved mobility.   Skin: Negative.   Allergic/Immunologic: Negative.   Neurological: Negative.   Hematological: Negative.   Psychiatric/Behavioral: Negative.       Today's Vitals   08/26/22 1444  BP: 124/80  Pulse: 75  Temp: 98.3 F (36.8 C)  SpO2: 98%  Weight: 117 lb (53.1 kg)  Height: 5\' 2"  (1.575 m)   Body mass index is 21.4 kg/m.  Wt Readings from Last 3 Encounters:  08/26/22 117 lb (53.1 kg)  11/06/21 122 lb (55.3 kg)  07/29/21 122 lb (55.3 kg)     Objective:  Physical Exam Constitutional:      General: She is not in acute distress.    Appearance: Normal appearance. She is well-developed. She is obese.  HENT:     Head: Normocephalic and atraumatic.  Right Ear: Hearing, tympanic membrane, ear canal and external ear normal. There is no impacted cerumen.     Left Ear: Hearing, tympanic membrane, ear canal and external ear normal. There is no impacted cerumen.  Eyes:     General: Lids are normal.     Extraocular Movements: Extraocular movements intact.     Conjunctiva/sclera: Conjunctivae normal.     Pupils: Pupils are equal, round, and reactive to light.     Funduscopic exam:    Right eye: No papilledema.        Left eye: No papilledema.  Neck:     Thyroid: No thyroid mass.     Vascular: No carotid bruit.  Cardiovascular:     Rate and Rhythm:  Normal rate and regular rhythm.     Pulses: Normal pulses.     Heart sounds: Normal heart sounds. No murmur heard. Pulmonary:     Effort: Pulmonary effort is normal.     Breath sounds: Normal breath sounds.  Chest:     Chest wall: No mass.  Breasts:    Tanner Score is 5.     Right: Normal. No mass or tenderness.     Left: Normal. No mass or tenderness.  Abdominal:     General: Abdomen is flat. Bowel sounds are normal. There is no distension.     Palpations: Abdomen is soft.     Tenderness: There is no abdominal tenderness.  Genitourinary:    Comments: Deferred  Musculoskeletal:        General: Tenderness present. No swelling. Normal range of motion.     Cervical back: Full passive range of motion without pain, normal range of motion and neck supple.     Right lower leg: No edema.     Left lower leg: No edema.  Lymphadenopathy:     Upper Body:     Right upper body: No supraclavicular, axillary or pectoral adenopathy.     Left upper body: No supraclavicular, axillary or pectoral adenopathy.  Skin:    General: Skin is warm and dry.     Capillary Refill: Capillary refill takes less than 2 seconds.     Comments: Tattoo R posterior shoulder, left breast  Neurological:     General: No focal deficit present.     Mental Status: She is alert and oriented to person, place, and time.     Cranial Nerves: No cranial nerve deficit.     Sensory: No sensory deficit.  Psychiatric:        Mood and Affect: Mood normal.        Behavior: Behavior normal.        Thought Content: Thought content normal.        Judgment: Judgment normal.         Assessment And Plan:     Encounter for general adult medical examination with abnormal findings Assessment & Plan: A full exam was performed. Importance of monthly self breast exams was discussed with the patient. PATIENT IS ADVISED TO GET 30-45 MINUTES REGULAR EXERCISE NO LESS THAN FOUR TO FIVE DAYS PER WEEK - BOTH WEIGHTBEARING EXERCISES AND AEROBIC  ARE RECOMMENDED.  PATIENT IS ADVISED TO FOLLOW A HEALTHY DIET WITH AT LEAST SIX FRUITS/VEGGIES PER DAY, DECREASE INTAKE OF RED MEAT, AND TO INCREASE FISH INTAKE TO TWO DAYS PER WEEK.  MEATS/FISH SHOULD NOT BE FRIED, BAKED OR BROILED IS PREFERABLE.  IT IS ALSO IMPORTANT TO CUT BACK ON YOUR SUGAR INTAKE. PLEASE AVOID ANYTHING WITH ADDED SUGAR, CORN SYRUP  OR OTHER SWEETENERS. IF YOU MUST USE A SWEETENER, YOU CAN TRY STEVIA. IT IS ALSO IMPORTANT TO AVOID ARTIFICIALLY SWEETENERS AND DIET BEVERAGES. LASTLY, I SUGGEST WEARING SPF 50 SUNSCREEN ON EXPOSED PARTS AND ESPECIALLY WHEN IN THE DIRECT SUNLIGHT FOR AN EXTENDED PERIOD OF TIME.  PLEASE AVOID FAST FOOD RESTAURANTS AND INCREASE YOUR WATER INTAKE.   Orders: -     CMP14+EGFR -     CBC -     Lipid panel -     VITAMIN D 25 Hydroxy (Vit-D Deficiency, Fractures)  Abnormal EKG Assessment & Plan: EKG performed, SB w/ T abnormality and anterolateral ischemia. She is without any anginal symptoms.   There are no new changes noted.   Orders: -     EKG 12-Lead  Fall (on) (from) other stairs and steps, sequela Assessment & Plan: Occurred on 08/10/22 when attempting to walk into her home. Discussed fall prevention - remove items away from doorways and ensure no throw rugs are used inside the home.    Acute pain of right shoulder Assessment & Plan: Declines Ortho eval, appears sx are gradually improving. I will refer her to Ortho if her sx recur. Advised she can apply topical pain cream to affected area as needed.      Decreased estrogen level Assessment & Plan: She agrees to bone density. She is encouraged to engage in weight-bearing exercises at least 3 days per week.   Orders: -     DG Bone Density; Future     Return for 1 year HM . Patient was given opportunity to ask questions. Patient verbalized understanding of the plan and was able to repeat key elements of the plan. All questions were answered to their satisfaction.   I, Gwynneth Aliment,  MD, have reviewed all documentation for this visit. The documentation on 08/26/22 for the exam, diagnosis, procedures, and orders are all accurate and complete.

## 2022-08-26 NOTE — Patient Instructions (Addendum)
Coronary Calcium Scan A coronary calcium scan is an imaging test used to look for deposits of plaque in the inner lining of the blood vessels of the heart (coronary arteries). Plaque is made up of calcium, protein, and fatty substances. These deposits of plaque can partly clog and narrow the coronary arteries without producing any symptoms or warning signs. This puts a person at risk for a heart attack. A coronary calcium scan is performed using a computed tomography (CT) scanner machine without using a dye (contrast). This test is recommended for people who are at moderate risk for heart disease. The test can find plaque deposits before symptoms develop. Tell a health care provider about: Any allergies you have. All medicines you are taking, including vitamins, herbs, eye drops, creams, and over-the-counter medicines. Any problems you or family members have had with anesthetic medicines. Any bleeding problems you have. Any surgeries you have had. Any medical conditions you have. Whether you are pregnant or may be pregnant. What are the risks? Generally, this is a safe procedure. However, problems may occur, including: Harm to a pregnant woman and her unborn baby. This test involves the use of radiation. Radiation exposure can be dangerous to a pregnant woman and her unborn baby. If you are pregnant or think you may be pregnant, you should not have this procedure done. A slight increase in the risk of cancer. This is because of the radiation involved in the test. The amount of radiation from one test is similar to the amount of radiation you are naturally exposed to over one year. What happens before the procedure? Ask your health care provider for any specific instructions on how to prepare for this procedure. You may be asked to avoid products that contain caffeine, tobacco, or nicotine for 4 hours before the procedure. What happens during the procedure?  You will undress and remove any jewelry  from your neck or chest. You may need to remove hearing aides and dentures. Women may need to remove their bras. You will put on a hospital gown. Sticky electrodes will be placed on your chest. The electrodes will be connected to an electrocardiogram (ECG) machine to record a tracing of the electrical activity of your heart. You will lie down on your back on a curved bed that is attached to the CT scanner. You may be given medicine to slow down your heart rate so that clear pictures can be created. You will be moved into the CT scanner, and the CT scanner will take pictures of your heart. During this time, you will be asked to lie still and hold your breath for 10-20 seconds at a time while each picture of your heart is being taken. The procedure may vary among health care providers and hospitals. What can I expect after the procedure? You can return to your normal activities. It is up to you to get the results of your procedure. Ask your health care provider, or the department that is doing the procedure, when your results will be ready. Summary A coronary calcium scan is an imaging test used to look for deposits of plaque in the inner lining of the blood vessels of the heart. Plaque is made up of calcium, protein, and fatty substances. A coronary calcium scan is performed using a CT scanner machine without contrast. Generally, this is a safe procedure. Tell your health care provider if you are pregnant or may be pregnant. Ask your health care provider for any specific instructions on how to   prepare for this procedure. You can return to your normal activities after the scan is done. This information is not intended to replace advice given to you by your health care provider. Make sure you discuss any questions you have with your health care provider. Document Revised: 01/12/2021 Document Reviewed: 01/12/2021 Elsevier Patient Education  2024 [employer] Inc[employer]  Health Maintenance, Female Adopting a  healthy lifestyle and getting preventive care are important in promoting health and wellness. Ask your health care provider about: The right schedule for you to have regular tests and exams. Things you can do on your own to prevent diseases and keep yourself healthy. What should I know about diet, weight, and exercise? Eat a healthy diet  Eat a diet that includes plenty of vegetables, fruits, low-fat dairy products, and lean protein. Do not eat a lot of foods that are high in solid fats, added sugars, or sodium. Maintain a healthy weight Body mass index (BMI) is used to identify weight problems. It estimates body fat based on height and weight. Your health care provider can help determine your BMI and help you achieve or maintain a healthy weight. Get regular exercise Get regular exercise. This is one of the most important things you can do for your health. Most adults should: Exercise for at least 150 minutes each week. The exercise should increase your heart rate and make you sweat (moderate-intensity exercise). Do strengthening exercises at least twice a week. This is in addition to the moderate-intensity exercise. Spend less time sitting. Even light physical activity can be beneficial. Watch cholesterol and blood lipids Have your blood tested for lipids and cholesterol at 77 years of age, then have this test every 5 years. Have your cholesterol levels checked more often if: Your lipid or cholesterol levels are high. You are older than 77 years of age. You are at high risk for heart disease. What should I know about cancer screening? Depending on your health history and family history, you may need to have cancer screening at various ages. This may include screening for: Breast cancer. Cervical cancer. Colorectal cancer. Skin cancer. Lung cancer. What should I know about heart disease, diabetes, and high blood pressure? Blood pressure and heart disease High blood pressure causes  heart disease and increases the risk of stroke. This is more likely to develop in people who have high blood pressure readings or are overweight. Have your blood pressure checked: Every 3-5 years if you are 18-39 years of age. Every year if you are 40 years old or older. Diabetes Have regular diabetes screenings. This checks your fasting blood sugar level. Have the screening done: Once every three years after age 40 if you are at a normal weight and have a low risk for diabetes. More often and at a younger age if you are overweight or have a high risk for diabetes. What should I know about preventing infection? Hepatitis B If you have a higher risk for hepatitis B, you should be screened for this virus. Talk with your health care provider to find out if you are at risk for hepatitis B infection. Hepatitis C Testing is recommended for: Everyone born from 1945 through 1965. Anyone with known risk factors for hepatitis C. Sexually transmitted infections (STIs) Get screened for STIs, including gonorrhea and chlamydia, if: You are sexually active and are younger than 77 years of age. You are older than 77 years of age and your health care provider tells you that you are at risk for this   type of infection. Your sexual activity has changed since you were last screened, and you are at increased risk for chlamydia or gonorrhea. Ask your health care provider if you are at risk. Ask your health care provider about whether you are at high risk for HIV. Your health care provider may recommend a prescription medicine to help prevent HIV infection. If you choose to take medicine to prevent HIV, you should first get tested for HIV. You should then be tested every 3 months for as long as you are taking the medicine. Pregnancy If you are about to stop having your period (premenopausal) and you may become pregnant, seek counseling before you get pregnant. Take 400 to 800 micrograms (mcg) of folic acid every day  if you become pregnant. Ask for birth control (contraception) if you want to prevent pregnancy. Osteoporosis and menopause Osteoporosis is a disease in which the bones lose minerals and strength with aging. This can result in bone fractures. If you are 65 years old or older, or if you are at risk for osteoporosis and fractures, ask your health care provider if you should: Be screened for bone loss. Take a calcium or vitamin D supplement to lower your risk of fractures. Be given hormone replacement therapy (HRT) to treat symptoms of menopause. Follow these instructions at home: Alcohol use Do not drink alcohol if: Your health care provider tells you not to drink. You are pregnant, may be pregnant, or are planning to become pregnant. If you drink alcohol: Limit how much you have to: 0-1 drink a day. Know how much alcohol is in your drink. In the [place] one drink equals one 12 oz bottle of beer (355 mL), one 5 oz glass of wine (148 mL), or one 1 oz glass of hard liquor (44 mL). Lifestyle Do not use any products that contain nicotine or tobacco. These products include cigarettes, chewing tobacco, and vaping devices, such as e-cigarettes. If you need help quitting, ask your health care provider. Do not use street drugs. Do not share needles. Ask your health care provider for help if you need support or information about quitting drugs. General instructions Schedule regular health, dental, and eye exams. Stay current with your vaccines. Tell your health care provider if: You often feel depressed. You have ever been abused or do not feel safe at home. Summary Adopting a healthy lifestyle and getting preventive care are important in promoting health and wellness. Follow your health care provider's instructions about healthy diet, exercising, and getting tested or screened for diseases. Follow your health care provider's instructions on monitoring your cholesterol and blood pressure. This  information is not intended to replace advice given to you by your health care provider. Make sure you discuss any questions you have with your health care provider. Document Revised: 06/24/2020 Document Reviewed: 06/24/2020 Elsevier Patient Education  2024 Elsevier Inc.  

## 2022-08-27 LAB — CMP14+EGFR
ALT: 9 IU/L (ref 0–32)
AST: 13 IU/L (ref 0–40)
Albumin: 4.2 g/dL (ref 3.8–4.8)
Alkaline Phosphatase: 71 IU/L (ref 44–121)
BUN/Creatinine Ratio: 16 (ref 12–28)
BUN: 12 mg/dL (ref 8–27)
Bilirubin Total: 0.5 mg/dL (ref 0.0–1.2)
CO2: 25 mmol/L (ref 20–29)
Calcium: 9.4 mg/dL (ref 8.7–10.3)
Chloride: 104 mmol/L (ref 96–106)
Creatinine, Ser: 0.74 mg/dL (ref 0.57–1.00)
Globulin, Total: 2.4 g/dL (ref 1.5–4.5)
Glucose: 93 mg/dL (ref 70–99)
Potassium: 4.4 mmol/L (ref 3.5–5.2)
Sodium: 143 mmol/L (ref 134–144)
Total Protein: 6.6 g/dL (ref 6.0–8.5)
eGFR: 83 mL/min/{1.73_m2} (ref >59)

## 2022-08-27 LAB — CBC
Hematocrit: 37.2 % (ref 34.0–46.6)
Hemoglobin: 12.6 g/dL (ref 11.1–15.9)
MCH: 31.3 pg (ref 26.6–33.0)
MCHC: 33.9 g/dL (ref 31.5–35.7)
MCV: 92 fL (ref 79–97)
Platelets: 335 10*3/uL (ref 150–450)
RBC: 4.03 x10E6/uL (ref 3.77–5.28)
RDW: 11.8 % (ref 11.7–15.4)
WBC: 4.4 10*3/uL (ref 3.4–10.8)

## 2022-08-27 LAB — LIPID PANEL
Chol/HDL Ratio: 2.3 ratio (ref 0.0–4.4)
Cholesterol, Total: 194 mg/dL (ref 100–199)
HDL: 83 mg/dL (ref >39)
LDL Chol Calc (NIH): 99 mg/dL (ref 0–99)
Triglycerides: 68 mg/dL (ref 0–149)
VLDL Cholesterol Cal: 12 mg/dL (ref 5–40)

## 2022-08-27 LAB — VITAMIN D 25 HYDROXY (VIT D DEFICIENCY, FRACTURES): Vit D, 25-Hydroxy: 21.1 ng/mL — ABNORMAL LOW (ref 30.0–100.0)

## 2022-08-30 DIAGNOSIS — E2839 Other primary ovarian failure: Secondary | ICD-10-CM | POA: Insufficient documentation

## 2022-08-30 DIAGNOSIS — R9431 Abnormal electrocardiogram [ECG] [EKG]: Secondary | ICD-10-CM | POA: Insufficient documentation

## 2022-08-30 DIAGNOSIS — Z0001 Encounter for general adult medical examination with abnormal findings: Secondary | ICD-10-CM | POA: Insufficient documentation

## 2022-08-30 DIAGNOSIS — M25511 Pain in right shoulder: Secondary | ICD-10-CM | POA: Insufficient documentation

## 2022-08-30 NOTE — Assessment & Plan Note (Signed)
Occurred on 08/10/22 when attempting to walk into her home. Discussed fall prevention - remove items away from doorways and ensure no throw rugs are used inside the home.

## 2022-08-30 NOTE — Assessment & Plan Note (Addendum)
EKG performed, SB w/ T abnormality and anterolateral ischemia. She is without any anginal symptoms.   There are no new changes noted.

## 2022-08-30 NOTE — Assessment & Plan Note (Signed)
A full exam was performed. Importance of monthly self breast exams was discussed with the patient. PATIENT IS ADVISED TO GET 30-45 MINUTES REGULAR EXERCISE NO LESS THAN FOUR TO FIVE DAYS PER WEEK - BOTH WEIGHTBEARING EXERCISES AND AEROBIC ARE RECOMMENDED.  PATIENT IS ADVISED TO FOLLOW A HEALTHY DIET WITH AT LEAST SIX FRUITS/VEGGIES PER DAY, DECREASE INTAKE OF RED MEAT, AND TO INCREASE FISH INTAKE TO TWO DAYS PER WEEK.  MEATS/FISH SHOULD NOT BE FRIED, BAKED OR BROILED IS PREFERABLE.  IT IS ALSO IMPORTANT TO CUT BACK ON YOUR SUGAR INTAKE. PLEASE AVOID ANYTHING WITH ADDED SUGAR, CORN SYRUP OR OTHER SWEETENERS. IF YOU MUST USE A SWEETENER, YOU CAN TRY STEVIA. IT IS ALSO IMPORTANT TO AVOID ARTIFICIALLY SWEETENERS AND DIET BEVERAGES. LASTLY, I SUGGEST WEARING SPF 50 SUNSCREEN ON EXPOSED PARTS AND ESPECIALLY WHEN IN THE DIRECT SUNLIGHT FOR AN EXTENDED PERIOD OF TIME.  PLEASE AVOID FAST FOOD RESTAURANTS AND INCREASE YOUR WATER INTAKE.  

## 2022-08-30 NOTE — Assessment & Plan Note (Signed)
She agrees to bone density. She is encouraged to engage in weight-bearing exercises at least 3 days per week.

## 2022-08-30 NOTE — Assessment & Plan Note (Signed)
Declines Ortho eval, appears sx are gradually improving. I will refer her to Ortho if her sx recur. Advised she can apply topical pain cream to affected area as needed.

## 2022-09-10 ENCOUNTER — Other Ambulatory Visit: Payer: Self-pay | Admitting: Internal Medicine

## 2022-09-10 DIAGNOSIS — Z1231 Encounter for screening mammogram for malignant neoplasm of breast: Secondary | ICD-10-CM

## 2022-09-10 DIAGNOSIS — E7800 Pure hypercholesterolemia, unspecified: Secondary | ICD-10-CM

## 2022-09-29 ENCOUNTER — Ambulatory Visit (HOSPITAL_COMMUNITY)
Admission: RE | Admit: 2022-09-29 | Discharge: 2022-09-29 | Disposition: A | Discharge status: Home / Self Care | Payer: 59 | Source: Ambulatory Visit | Attending: Internal Medicine | Admitting: Internal Medicine

## 2022-09-29 DIAGNOSIS — E7800 Pure hypercholesterolemia, unspecified: Secondary | ICD-10-CM | POA: Insufficient documentation

## 2022-10-22 ENCOUNTER — Encounter: Payer: Self-pay | Admitting: Internal Medicine

## 2022-12-25 NOTE — Progress Notes (Signed)
SCANNED FORM.

## 2023-03-11 ENCOUNTER — Other Ambulatory Visit: Payer: 59

## 2023-03-11 ENCOUNTER — Ambulatory Visit: Payer: 59

## 2023-03-17 ENCOUNTER — Other Ambulatory Visit: Payer: Self-pay | Admitting: Medical Genetics

## 2023-03-29 ENCOUNTER — Telehealth: Payer: Self-pay

## 2023-03-29 NOTE — Telephone Encounter (Signed)
 Patient called regarding shoulder pain, patient was encouraged to visit a [facility] near her.

## 2023-08-30 ENCOUNTER — Ambulatory Visit: Payer: 59 | Admitting: Internal Medicine

## 2023-08-30 ENCOUNTER — Encounter: Payer: Self-pay | Admitting: Internal Medicine

## 2023-08-30 ENCOUNTER — Other Ambulatory Visit (HOSPITAL_COMMUNITY)
Admission: RE | Admit: 2023-08-30 | Discharge: 2023-08-30 | Disposition: A | Discharge status: Home / Self Care | Payer: Self-pay | Source: Ambulatory Visit | Attending: Medical Genetics | Admitting: Medical Genetics

## 2023-08-30 VITALS — BP 122/70 | HR 66 | Temp 98.5°F | Ht 62.0 in | Wt 117.0 lb

## 2023-08-30 DIAGNOSIS — E7800 Pure hypercholesterolemia, unspecified: Secondary | ICD-10-CM | POA: Diagnosis not present

## 2023-08-30 DIAGNOSIS — Z833 Family history of diabetes mellitus: Secondary | ICD-10-CM | POA: Diagnosis not present

## 2023-08-30 DIAGNOSIS — E559 Vitamin D deficiency, unspecified: Secondary | ICD-10-CM

## 2023-08-30 DIAGNOSIS — Z8249 Family history of ischemic heart disease and other diseases of the circulatory system: Secondary | ICD-10-CM

## 2023-08-30 LAB — HEMOGLOBIN A1C
Est. average glucose Bld gHb Est-mCnc: 131 mg/dL
Hgb A1c MFr Bld: 6.2 % — ABNORMAL HIGH (ref 4.8–5.6)

## 2023-08-30 NOTE — Progress Notes (Signed)
 I,[name], CMA,acting as a Neurosurgeon for Catheryn LOISE Slocumb, MD.,have documented all relevant documentation on the behalf of Catheryn LOISE Slocumb, MD,as directed by  Catheryn LOISE Slocumb, MD while in the presence of Catheryn LOISE Slocumb, MD.  Subjective:    Patient ID: Ashley Dominguez , female    DOB: [birth year] , 78 y.o.   MRN: [id number]  Chief Complaint  Patient presents with   Hyperlipidemia    Patient presents today for a chol check. Patient doesn't have any questions or concerns at this time.    HPI Discussed the use of AI scribe software for clinical note transcription with the patient, who gave verbal consent to proceed.  History of Present Illness Ashley Dominguez is a 78 year old female who presents for a routine follow-up and discussion about participation in a clinical trial.  She has no current health concerns and is feeling well. She takes over-the-counter vitamin D  in capsule form, although she occasionally misses doses. Her history includes cholesterol issues, which improved last year.  A cardiac calcium score test conducted last August showed a score of zero.  She is interested in participating in the [employer] study but has not yet scheduled an appointment for the necessary blood work. She has completed all preliminary steps but has been unable to find the correct contact information to schedule the appointment.  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  No current health concerns reported.    Past Medical History:  Diagnosis Date   Osteoporosis      Family History  Problem Relation Age of Onset   Cancer Mother    Alcohol abuse Father    Diabetes Father    Diabetes Brother    Cancer Maternal Aunt    Breast cancer Neg Hx      Current Outpatient Medications:    cholecalciferol (VITAMIN D3) 25 MCG (1000 UNIT) tablet, Take 1,000 Units by mouth daily., Disp: , Rfl:    TURMERIC PO, Take 1 capsule  by mouth daily at 6 (six) AM., Disp: , Rfl:    No Known Allergies     . The patient's tobacco use is:  Social History   Tobacco Use  Smoking Status Never  Smokeless Tobacco Never  . She has been exposed to passive smoke. The patient's alcohol use is:  Social History   Substance and Sexual Activity  Alcohol Use Yes   Comment: 3-4 beers a day, liquor use 3 times a week   Review of Systems  Constitutional: Negative.   HENT: Negative.    Eyes: Negative.   Respiratory: Negative.    Cardiovascular: Negative.   Gastrointestinal: Negative.   Endocrine: Negative.   Genitourinary: Negative.   Musculoskeletal: Negative.   Skin: Negative.   Allergic/Immunologic: Negative.   Neurological: Negative.   Hematological: Negative.   Psychiatric/Behavioral: Negative.       Today's Vitals   08/30/23 1425  BP: 122/70  Pulse: 66  Temp: 98.5 F (36.9 C)  Weight: 117 lb (53.1 kg)  Height: 5' 2 (1.575 m)  PainSc: 0-No pain   Body mass index is 21.4 kg/m.  Wt Readings from Last 3 Encounters:  08/30/23 117 lb (53.1 kg)  08/26/22 117 lb (53.1 kg)  11/06/21 122 lb (55.3 kg)     Objective:  Physical Exam Vitals and nursing note reviewed.  Constitutional:      Appearance: Normal appearance.  HENT:  Head: Normocephalic and atraumatic.  Eyes:     Extraocular Movements: Extraocular movements intact.  Cardiovascular:     Rate and Rhythm: Normal rate and regular rhythm.     Heart sounds: Normal heart sounds.  Pulmonary:     Effort: Pulmonary effort is normal.     Breath sounds: Normal breath sounds.  Musculoskeletal:     Cervical back: Normal range of motion.  Skin:    General: Skin is warm.  Neurological:     General: No focal deficit present.     Mental Status: She is alert.  Psychiatric:        Mood and Affect: Mood normal.        Behavior: Behavior normal.         Assessment And Plan:     Pure hypercholesterolemia Assessment & Plan: Cholesterol levels require  regular monitoring. She has had calcium score within the past year, which was ZERO.   - Regularly monitor cholesterol levels. - Follow heart healthy lifestyle - regular exercise, good sleep habits, increased fiber intake and avoidance of fried foods.   Orders: -     CBC -     CMP14+EGFR -     Lipid panel  Vitamin D  deficiency disease Assessment & Plan: I WILL CHECK A VIT D LEVEL AND SUPPLEMENT AS NEEDED.  ALSO ENCOURAGED TO SPEND 15 MINUTES IN THE SUN DAILY.   Orders: -     VITAMIN D  25 Hydroxy (Vit-D Deficiency, Fractures)  Family history of heart disease Assessment & Plan: Potential genetic predisposition discussed. Previous cardiac calcium score was zero, indicating no arterial calcifications. - Order lipoprotein A test.   Family history of diabetes mellitus in brother -     Hemoglobin A1c  General Health Maintenance Participating in [employer] study to assess genetic predispositions. - Encourage participation in [employer] study. - Assist in scheduling blood work appointment for [employer] study. Return in about 6 months (around 03/01/2024), or physical. Patient was given opportunity to ask questions. Patient verbalized understanding of the plan and was able to repeat key elements of the plan. All questions were answered to their satisfaction.   I, Catheryn LOISE Slocumb, MD, have reviewed all documentation for this visit. The documentation on 08/30/23 for the exam, diagnosis, procedures, and orders are all accurate and complete.

## 2023-08-30 NOTE — Patient Instructions (Addendum)

## 2023-08-31 ENCOUNTER — Ambulatory Visit: Payer: Self-pay | Admitting: Internal Medicine

## 2023-08-31 LAB — CMP14+EGFR
ALT: 9 IU/L (ref 0–32)
AST: 15 IU/L (ref 0–40)
Albumin: 4.3 g/dL (ref 3.8–4.8)
Alkaline Phosphatase: 68 IU/L (ref 44–121)
BUN/Creatinine Ratio: 14 (ref 12–28)
BUN: 11 mg/dL (ref 8–27)
Bilirubin Total: 0.5 mg/dL (ref 0.0–1.2)
CO2: 23 mmol/L (ref 20–29)
Calcium: 9.7 mg/dL (ref 8.7–10.3)
Chloride: 102 mmol/L (ref 96–106)
Creatinine, Ser: 0.78 mg/dL (ref 0.57–1.00)
Globulin, Total: 2.3 g/dL (ref 1.5–4.5)
Glucose: 96 mg/dL (ref 70–99)
Potassium: 4.2 mmol/L (ref 3.5–5.2)
Sodium: 142 mmol/L (ref 134–144)
Total Protein: 6.6 g/dL (ref 6.0–8.5)
eGFR: 78 mL/min/1.73 (ref >59)

## 2023-08-31 LAB — LIPID PANEL
Chol/HDL Ratio: 2.3 ratio (ref 0.0–4.4)
Cholesterol, Total: 173 mg/dL (ref 100–199)
HDL: 76 mg/dL (ref >39)
LDL Chol Calc (NIH): 84 mg/dL (ref 0–99)
Triglycerides: 71 mg/dL (ref 0–149)
VLDL Cholesterol Cal: 13 mg/dL (ref 5–40)

## 2023-08-31 LAB — CBC
Hematocrit: 40.3 % (ref 34.0–46.6)
Hemoglobin: 12.8 g/dL (ref 11.1–15.9)
MCH: 30.4 pg (ref 26.6–33.0)
MCHC: 31.8 g/dL (ref 31.5–35.7)
MCV: 96 fL (ref 79–97)
Platelets: 372 x10E3/uL (ref 150–450)
RBC: 4.21 x10E6/uL (ref 3.77–5.28)
RDW: 12.4 % (ref 11.7–15.4)
WBC: 4.1 x10E3/uL (ref 3.4–10.8)

## 2023-08-31 LAB — VITAMIN D 25 HYDROXY (VIT D DEFICIENCY, FRACTURES): Vit D, 25-Hydroxy: 51.6 ng/mL (ref 30.0–100.0)

## 2023-09-05 DIAGNOSIS — Z8249 Family history of ischemic heart disease and other diseases of the circulatory system: Secondary | ICD-10-CM | POA: Insufficient documentation

## 2023-09-05 DIAGNOSIS — E559 Vitamin D deficiency, unspecified: Secondary | ICD-10-CM | POA: Insufficient documentation

## 2023-09-05 DIAGNOSIS — E7800 Pure hypercholesterolemia, unspecified: Secondary | ICD-10-CM | POA: Insufficient documentation

## 2023-09-05 NOTE — Assessment & Plan Note (Signed)
 Potential genetic predisposition discussed. Previous cardiac calcium score was zero, indicating no arterial calcifications. - Order lipoprotein A test.

## 2023-09-05 NOTE — Assessment & Plan Note (Signed)
 Cholesterol levels require regular monitoring. She has had calcium score within the past year, which was ZERO.   - Regularly monitor cholesterol levels. - Follow heart healthy lifestyle - regular exercise, good sleep habits, increased fiber intake and avoidance of fried foods.

## 2023-09-05 NOTE — Assessment & Plan Note (Signed)
 I WILL CHECK A VIT D LEVEL AND SUPPLEMENT AS NEEDED.  ALSO ENCOURAGED TO SPEND 15 MINUTES IN THE SUN DAILY.

## 2023-09-13 LAB — GENECONNECT MOLECULAR SCREEN: Genetic Analysis Overall Interpretation: NEGATIVE

## 2024-03-08 ENCOUNTER — Encounter: Payer: Self-pay | Admitting: Internal Medicine

## 2024-03-08 ENCOUNTER — Ambulatory Visit: Payer: Self-pay | Admitting: Internal Medicine

## 2024-03-08 VITALS — BP 140/70 | HR 62 | Temp 98.3°F | Ht 62.0 in | Wt 127.8 lb

## 2024-03-08 DIAGNOSIS — Z0001 Encounter for general adult medical examination with abnormal findings: Secondary | ICD-10-CM

## 2024-03-08 DIAGNOSIS — R030 Elevated blood-pressure reading, without diagnosis of hypertension: Secondary | ICD-10-CM

## 2024-03-08 DIAGNOSIS — Z833 Family history of diabetes mellitus: Secondary | ICD-10-CM | POA: Diagnosis not present

## 2024-03-08 DIAGNOSIS — Z8249 Family history of ischemic heart disease and other diseases of the circulatory system: Secondary | ICD-10-CM

## 2024-03-08 DIAGNOSIS — Z1231 Encounter for screening mammogram for malignant neoplasm of breast: Secondary | ICD-10-CM | POA: Diagnosis not present

## 2024-03-08 DIAGNOSIS — R9431 Abnormal electrocardiogram [ECG] [EKG]: Secondary | ICD-10-CM | POA: Diagnosis not present

## 2024-03-08 DIAGNOSIS — E2839 Other primary ovarian failure: Secondary | ICD-10-CM | POA: Diagnosis not present

## 2024-03-08 DIAGNOSIS — E7800 Pure hypercholesterolemia, unspecified: Secondary | ICD-10-CM

## 2024-03-08 DIAGNOSIS — E559 Vitamin D deficiency, unspecified: Secondary | ICD-10-CM
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
## 2024-03-08 NOTE — Patient Instructions (Addendum)
 Hibiscus tea High potassium fruits/veggies  Hypokalemia Hypokalemia means that the amount of potassium in the blood is lower than normal. Potassium is a mineral (electrolyte) that helps regulate the amount of fluid in the body. It also stimulates muscle tightening (contraction) and helps nerves work properly. Normally, most of the body's potassium is inside cells, and only a very small amount is in the blood. Because the amount in the blood is so small, minor changes to potassium levels in the blood can be life-threatening. What are the causes? This condition may be caused by: Antibiotic medicine. Diarrhea or vomiting. Taking too much of a medicine that helps you have a bowel movement (laxative) can cause diarrhea and lead to hypokalemia. Chronic kidney disease (CKD). Medicines that help the body get rid of excess fluid (diuretics). Eating disorders, such as anorexia or bulimia. Low magnesium levels in the body. Sweating a lot. What are the signs or symptoms? Symptoms of this condition include: Weakness. Constipation. Fatigue. Muscle cramps. Mental confusion. Skipped heartbeats or irregular heartbeat (palpitations). Tingling or numbness. How is this diagnosed? This condition is diagnosed with a blood test. How is this treated? This condition may be treated by: Taking potassium supplements. Adjusting the medicines that you take. Eating more foods that contain a lot of potassium. If your potassium level is very low, you may need to get potassium through an IV and be monitored in the hospital. Follow these instructions at home: Eating and drinking  Eat a healthy diet. A healthy diet includes fresh fruits and vegetables, whole grains, healthy fats, and lean proteins. If told, eat more foods that contain a lot of potassium. These include: Nuts, such as peanuts and pistachios. Seeds, such as sunflower seeds and pumpkin seeds. Peas, lentils, and lima beans. Whole grain and bran  cereals and breads. Fresh fruits and vegetables, such as apricots, avocado, bananas, cantaloupe, kiwi, oranges, tomatoes, asparagus, and potatoes. Juices, such as orange, tomato, and prune. Lean meats, including fish. Milk and milk products, such as yogurt. General instructions Take over-the-counter and prescription medicines only as told by your health care provider. This includes vitamins, natural food products, and supplements. Keep all follow-up visits. This is important. Contact a health care provider if: You have weakness that gets worse. You feel your heart pounding or racing. You vomit. You have diarrhea. You have diabetes and you have trouble keeping your blood sugar in your target range. Get help right away if: You have chest pain. You have shortness of breath. You have vomiting or diarrhea that lasts for more than 2 days. You faint. These symptoms may be an emergency. Get help right away. Call 911. Do not wait to see if the symptoms will go away. Do not drive yourself to the hospital. Summary Hypokalemia means that the amount of potassium in the blood is lower than normal. This condition is diagnosed with a blood test. Hypokalemia may be treated by taking potassium supplements, adjusting the medicines that you take, or eating more foods that are high in potassium. If your potassium level is very low, you may need to get potassium through an IV and be monitored in the hospital. This information is not intended to replace advice given to you by your health care provider. Make sure you discuss any questions you have with your health care provider. Document Revised: 10/17/2020 Document Reviewed: 10/17/2020 Elsevier Patient Education  2024 [employer]  Health Maintenance, Female Adopting a healthy lifestyle and getting preventive care are important in promoting health and wellness.  Ask your health care provider about: The right schedule for you to have regular tests and  exams. Things you can do on your own to prevent diseases and keep yourself healthy. What should I know about diet, weight, and exercise? Eat a healthy diet  Eat a diet that includes plenty of vegetables, fruits, low-fat dairy products, and lean protein. Do not eat a lot of foods that are high in solid fats, added sugars, or sodium. Maintain a healthy weight Body mass index (BMI) is used to identify weight problems. It estimates body fat based on height and weight. Your health care provider can help determine your BMI and help you achieve or maintain a healthy weight. Get regular exercise Get regular exercise. This is one of the most important things you can do for your health. Most adults should: Exercise for at least 150 minutes each week. The exercise should increase your heart rate and make you sweat (moderate-intensity exercise). Do strengthening exercises at least twice a week. This is in addition to the moderate-intensity exercise. Spend less time sitting. Even light physical activity can be beneficial. Watch cholesterol and blood lipids Have your blood tested for lipids and cholesterol at 79 years of age, then have this test every 5 years. Have your cholesterol levels checked more often if: Your lipid or cholesterol levels are high. You are older than 79 years of age. You are at high risk for heart disease. What should I know about cancer screening? Depending on your health history and family history, you may need to have cancer screening at various ages. This may include screening for: Breast cancer. Cervical cancer. Colorectal cancer. Skin cancer. Lung cancer. What should I know about heart disease, diabetes, and high blood pressure? Blood pressure and heart disease High blood pressure causes heart disease and increases the risk of stroke. This is more likely to develop in people who have high blood pressure readings or are overweight. Have your blood pressure checked: Every  3-5 years if you are 60-84 years of age. Every year if you are 40 years old or older. Diabetes Have regular diabetes screenings. This checks your fasting blood sugar level. Have the screening done: Once every three years after age 39 if you are at a normal weight and have a low risk for diabetes. More often and at a younger age if you are overweight or have a high risk for diabetes. What should I know about preventing infection? Hepatitis B If you have a higher risk for hepatitis B, you should be screened for this virus. Talk with your health care provider to find out if you are at risk for hepatitis B infection. Hepatitis C Testing is recommended for: Everyone born from [birth year] through 1965. Anyone with known risk factors for hepatitis C. Sexually transmitted infections (STIs) Get screened for STIs, including gonorrhea and chlamydia, if: You are sexually active and are younger than 79 years of age. You are older than 79 years of age and your health care provider tells you that you are at risk for this type of infection. Your sexual activity has changed since you were last screened, and you are at increased risk for chlamydia or gonorrhea. Ask your health care provider if you are at risk. Ask your health care provider about whether you are at high risk for HIV. Your health care provider may recommend a prescription medicine to help prevent HIV infection. If you choose to take medicine to prevent HIV, you should first get tested for HIV.  You should then be tested every 3 months for as long as you are taking the medicine. Pregnancy If you are about to stop having your period (premenopausal) and you may become pregnant, seek counseling before you get pregnant. Take 400 to 800 micrograms (mcg) of folic acid every day if you become pregnant. Ask for birth control (contraception) if you want to prevent pregnancy. Osteoporosis and menopause Osteoporosis is a disease in which the bones lose minerals and  strength with aging. This can result in bone fractures. If you are 57 years old or older, or if you are at risk for osteoporosis and fractures, ask your health care provider if you should: Be screened for bone loss. Take a calcium or vitamin D  supplement to lower your risk of fractures. Be given hormone replacement therapy (HRT) to treat symptoms of menopause. Follow these instructions at home: Alcohol use Do not drink alcohol if: Your health care provider tells you not to drink. You are pregnant, may be pregnant, or are planning to become pregnant. If you drink alcohol: Limit how much you have to: 0-1 drink a day. Know how much alcohol is in your drink. In the [place] one drink equals one 12 oz bottle of beer (355 mL), one 5 oz glass of wine (148 mL), or one 1 oz glass of hard liquor (44 mL). Lifestyle Do not use any products that contain nicotine or tobacco. These products include cigarettes, chewing tobacco, and vaping devices, such as e-cigarettes. If you need help quitting, ask your health care provider. Do not use street drugs. Do not share needles. Ask your health care provider for help if you need support or information about quitting drugs. General instructions Schedule regular health, dental, and eye exams. Stay current with your vaccines. Tell your health care provider if: You often feel depressed. You have ever been abused or do not feel safe at home. Summary Adopting a healthy lifestyle and getting preventive care are important in promoting health and wellness. Follow your health care provider's instructions about healthy diet, exercising, and getting tested or screened for diseases. Follow your health care provider's instructions on monitoring your cholesterol and blood pressure. This information is not intended to replace advice given to you by your health care provider. Make sure you discuss any questions you have with your health care provider. Document Revised:  06/24/2020 Document Reviewed: 06/24/2020 Elsevier Patient Education  2024 [employer].

## 2024-03-09 LAB — CMP14+EGFR
ALT: 11 IU/L (ref 0–32)
AST: 18 IU/L (ref 0–40)
Albumin: 4.6 g/dL (ref 3.8–4.8)
Alkaline Phosphatase: 70 IU/L (ref 49–135)
BUN/Creatinine Ratio: 18 (ref 12–28)
BUN: 12 mg/dL (ref 8–27)
Bilirubin Total: 0.6 mg/dL (ref 0.0–1.2)
CO2: 23 mmol/L (ref 20–29)
Calcium: 10 mg/dL (ref 8.7–10.3)
Chloride: 102 mmol/L (ref 96–106)
Creatinine, Ser: 0.68 mg/dL (ref 0.57–1.00)
Globulin, Total: 2.3 g/dL (ref 1.5–4.5)
Glucose: 93 mg/dL (ref 70–99)
Potassium: 4.4 mmol/L (ref 3.5–5.2)
Sodium: 140 mmol/L (ref 134–144)
Total Protein: 6.9 g/dL (ref 6.0–8.5)
eGFR: 89 mL/min/1.73

## 2024-03-09 LAB — VITAMIN B12: Vitamin B-12: 480 pg/mL (ref 232–1245)

## 2024-03-09 LAB — CBC
Hematocrit: 38.6 % (ref 34.0–46.6)
Hemoglobin: 12.5 g/dL (ref 11.1–15.9)
MCH: 30.9 pg (ref 26.6–33.0)
MCHC: 32.4 g/dL (ref 31.5–35.7)
MCV: 95 fL (ref 79–97)
Platelets: 368 x10E3/uL (ref 150–450)
RBC: 4.05 x10E6/uL (ref 3.77–5.28)
RDW: 12.5 % (ref 11.7–15.4)
WBC: 4.5 x10E3/uL (ref 3.4–10.8)

## 2024-03-09 LAB — HEMOGLOBIN A1C
Est. average glucose Bld gHb Est-mCnc: 128 mg/dL
Hgb A1c MFr Bld: 6.1 % — ABNORMAL HIGH (ref 4.8–5.6)

## 2024-03-09 LAB — TSH: TSH: 0.806 u[IU]/mL (ref 0.450–4.500)

## 2024-03-09 LAB — MICROSCOPIC EXAMINATION
Bacteria, UA: NONE SEEN
Casts: NONE SEEN /LPF
Epithelial Cells (non renal): NONE SEEN /HPF (ref 0–10)
RBC, Urine: NONE SEEN /HPF (ref 0–2)
WBC, UA: NONE SEEN /HPF (ref 0–5)

## 2024-03-09 LAB — LIPOPROTEIN A (LPA): Lipoprotein (a): 185.6 nmol/L — ABNORMAL HIGH

## 2024-03-09 LAB — URINALYSIS, COMPLETE
Bilirubin, UA: NEGATIVE
Glucose, UA: NEGATIVE
Ketones, UA: NEGATIVE
Leukocytes,UA: NEGATIVE
Nitrite, UA: NEGATIVE
Protein,UA: NEGATIVE
RBC, UA: NEGATIVE
Specific Gravity, UA: 1.02 (ref 1.005–1.030)
Urobilinogen, Ur: 0.2 mg/dL (ref 0.2–1.0)
pH, UA: 6 (ref 5.0–7.5)

## 2024-03-09 LAB — LIPID PANEL
Chol/HDL Ratio: 2.2 ratio (ref 0.0–4.4)
Cholesterol, Total: 207 mg/dL — ABNORMAL HIGH (ref 100–199)
HDL: 94 mg/dL
LDL Chol Calc (NIH): 99 mg/dL (ref 0–99)
Triglycerides: 78 mg/dL (ref 0–149)
VLDL Cholesterol Cal: 14 mg/dL (ref 5–40)

## 2024-03-12 ENCOUNTER — Ambulatory Visit: Payer: Self-pay | Admitting: Internal Medicine

## 2024-03-12 DIAGNOSIS — R030 Elevated blood-pressure reading, without diagnosis of hypertension: Secondary | ICD-10-CM | POA: Insufficient documentation

## 2024-03-12 NOTE — Assessment & Plan Note (Signed)
 Blood pressure elevated with readings of 135/80 mmHg and 155/80 mmHg. Possible contributors: recent weight gain and increased alcohol intake. - Scheduled nurse visit in three weeks to recheck blood pressure. - Incorporate hibiscus tea into diet. - Consume high potassium fruits and vegetables. - Encouraged regular exercise, such as walking on campus. - Ordered liver, kidney function, cholesterol, blood count, and prediabetes tests. - Ordered urine test to monitor kidney function.
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
## 2024-03-12 NOTE — Assessment & Plan Note (Signed)
 Cholesterol levels require regular monitoring. She has had calcium score within the past year, which was ZERO.   - Regularly monitor cholesterol levels. - Follow heart healthy lifestyle - regular exercise, good sleep habits, increased fiber intake and avoidance of fried foods.

## 2024-03-12 NOTE — Assessment & Plan Note (Signed)

## 2024-03-15 ENCOUNTER — Other Ambulatory Visit: Payer: Self-pay | Admitting: Internal Medicine

## 2024-03-15 DIAGNOSIS — E7841 Elevated Lipoprotein(a): Secondary | ICD-10-CM
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
## 2024-03-23 ENCOUNTER — Ambulatory Visit: Admitting: Cardiology

## 2024-03-23 ENCOUNTER — Encounter: Payer: Self-pay | Admitting: Cardiology

## 2024-03-23 ENCOUNTER — Other Ambulatory Visit (HOSPITAL_COMMUNITY): Payer: Self-pay

## 2024-03-23 VITALS — BP 135/69 | HR 69 | Resp 16 | Ht 62.0 in | Wt 122.9 lb

## 2024-03-23 DIAGNOSIS — E785 Hyperlipidemia, unspecified: Secondary | ICD-10-CM

## 2024-03-23 MED ORDER — OMRON 3 SERIES BP MONITOR DEVI
1.0000 | Freq: Two times a day (BID) | 0 refills | Status: AC
  Filled 2024-03-23: qty 1, 30d supply, fill #0

## 2024-03-23 NOTE — Patient Instructions (Signed)
 Medication Instructions:  Your physician recommends that you continue on your current medications as directed. Please refer to the Current Medication list given to you today.  *If you need a refill on your cardiac medications before your next appointment, please call your pharmacy*  Lab Work: NONE If you have labs (blood work) drawn today and your tests are completely normal, you will receive your results only by: MyChart Message (if you have MyChart) OR A paper copy in the mail If you have any lab test that is abnormal or we need to change your treatment, we will call you to review the results.  Testing/Procedures: NONE  Follow-Up: At [facility], you and your health needs are our priority.  As part of our continuing mission to provide you with exceptional heart care, our providers are all part of one team.  This team includes your primary Cardiologist (physician) and Advanced Practice Providers or APPs (Physician Assistants and Nurse Practitioners) who all work together to provide you with the care you need, when you need it.  Your next appointment:   6 month(s)  Provider:   [place], MD  We recommend signing up for the patient portal called MyChart.  Sign up information is provided on this After Visit Summary.  MyChart is used to connect with patients for Virtual Visits (Telemedicine).  Patients are able to view lab/test results, encounter notes, upcoming appointments, etc.  Non-urgent messages can be sent to your provider as well.   To learn more about what you can do with MyChart, go to [email or web address].   Other Instructions  Blood pressure diary: take your blood pressure twice daily for 10 days and send us  the readings.

## 2024-04-04 ENCOUNTER — Ambulatory Visit: Payer: Self-pay

## 2025-03-12 ENCOUNTER — Encounter: Payer: Self-pay | Admitting: Internal Medicine
